# Patient Record
Sex: Male | Born: 1995 | Race: White | Hispanic: No | Smoking: Never smoker
Health system: Southern US, Community
[De-identification: ages and names within clinical notes are randomized; demographics above are authoritative.]

## PROBLEM LIST (undated history)

## (undated) DIAGNOSIS — E785 Hyperlipidemia, unspecified: Secondary | ICD-10-CM

## (undated) DIAGNOSIS — Z8709 Personal history of other diseases of the respiratory system: Secondary | ICD-10-CM

## (undated) DIAGNOSIS — K219 Gastro-esophageal reflux disease without esophagitis: Secondary | ICD-10-CM

## (undated) HISTORY — PX: CYST EXCISION: SHX5701

## (undated) HISTORY — PX: WISDOM TOOTH EXTRACTION: SHX21

---
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
## 2016-07-09 ENCOUNTER — Encounter: Payer: Self-pay | Admitting: *Deleted

## 2016-07-09 ENCOUNTER — Emergency Department: Payer: BLUE CROSS/BLUE SHIELD

## 2016-07-09 ENCOUNTER — Emergency Department
Admission: EM | Admit: 2016-07-09 | Discharge: 2016-07-09 | Disposition: A | Discharge status: Home / Self Care | Payer: BLUE CROSS/BLUE SHIELD | Attending: Emergency Medicine | Admitting: Emergency Medicine

## 2016-07-09 DIAGNOSIS — G8929 Other chronic pain: Secondary | ICD-10-CM | POA: Diagnosis not present

## 2016-07-09 DIAGNOSIS — M25531 Pain in right wrist: Secondary | ICD-10-CM | POA: Insufficient documentation

## 2016-07-09 NOTE — ED Notes (Signed)
See triage note  Having pain pain to right wrist over the past couple of days. fell on wrist during b/b game about 1 year ago  No deformity noted   Positive pulses

## 2016-07-09 NOTE — ED Provider Notes (Signed)
[facility] Emergency Department Provider Note  ____________________________________________   First MD Initiated Contact with Patient 07/09/16 [phone]     (approximate)  I have reviewed the triage vital signs and the nursing notes.   HISTORY  Chief Complaint Wrist Pain    HPI Scott Rivas is a 21 y.o. male is here complaining of right wrist pain. Patient states that he injured his wrist a year ago in a basketball game and landed on his hand and wrist. He had pain at that time but never sought medical attention for it.Patient states his continued to have wrist pain. He is taken ibuprofen infrequently for his pain. Patient is right-hand dominant.   History reviewed. No pertinent past medical history.  There are no active problems to display for this patient.   No past surgical history on file.  Prior to Admission medications   Not on File    Allergies Patient has no known allergies.  History reviewed. No pertinent family history.  Social History Social History  Substance Use Topics  . Smoking status: Not on file  . Smokeless tobacco: Not on file  . Alcohol use Not on file    Review of Systems Constitutional: No fever/chills Cardiovascular: Denies chest pain. Respiratory: Denies shortness of breath. Gastrointestinal:   No nausea, no vomiting. Musculoskeletal: Positive right wrist pain. Skin: Negative for rash. Neurological: Negative for focal weakness or numbness.  10-point ROS otherwise negative.  ____________________________________________   PHYSICAL EXAM:  VITAL SIGNS: ED Triage Vitals [07/09/16 0725]  Enc Vitals Group     BP (!) 143/86     Pulse Rate 75     Resp 18     Temp 97.5 F (36.4 C)     Temp Source Oral     SpO2 99 %     Weight 130 lb (59 kg)     Height 5\' 10"  (1.778 m)     Head Circumference      Peak Flow      Pain Score      Pain Loc      Pain Edu?      Excl. in GC?     Constitutional: Alert and  oriented. Well appearing and in no acute distress. Eyes: Conjunctivae are normal. PERRL. EOMI. Head: Atraumatic. Nose: No congestion/rhinnorhea. Neck: No stridor.   Cardiovascular: Normal rate, regular rhythm. Grossly normal heart sounds.  Good peripheral circulation. Respiratory: Normal respiratory effort.  No retractions. Lungs CTAB. Musculoskeletal: On examination of the right hand and wrist there is no gross deformity noted there is no soft tissue swelling present. Range of motion is without restrictions with minimal pain. There is some tenderness on palpation of the distal radius. Digits distally move without difficulty and patient is able make a complete fist without pain. Capillary refill is less than 3 seconds and motor sensory function intact. Neurologic:  Normal speech and language. No gross focal neurologic deficits are appreciated. No gait instability. Skin:  Skin is warm, dry and intact. No erythema, ecchymosis or abrasions are noted. Psychiatric: Mood and affect are normal. Speech and behavior are normal.  ____________________________________________   LABS (all labs ordered are listed, but only abnormal results are displayed)  Labs Reviewed - No data to display  RADIOLOGY Right wrist. Radiologist is negative for fracture dislocation. I, Tommi Rumps, personally viewed and evaluated these images (plain radiographs) as part of my medical decision making, as well as reviewing the written report by the radiologist. ____________________________________________   PROCEDURES  Procedure(s) performed: None  Procedures  Critical Care performed: No  ____________________________________________   INITIAL IMPRESSION / ASSESSMENT AND PLAN / ED COURSE  Pertinent labs & imaging results that were available during my care of the patient were reviewed by me and considered in my medical decision making (see chart for details).    Clinical Course    Patient will continue  taking ibuprofen as needed for pain. He is to call and make an appointment with Dr. Ernest PineHooten who is the orthopedist on call today. He was made aware that his x-ray did not show any bony abnormalities.  ____________________________________________   FINAL CLINICAL IMPRESSION(S) / ED DIAGNOSES  Final diagnoses:  Chronic pain of right wrist      NEW MEDICATIONS STARTED DURING THIS VISIT:  There are no discharge medications for this patient.    Note:  This document was prepared using Dragon voice recognition software and may include unintentional dictation errors.    Tommi Rumpshonda L Billey Wojciak, PA-C 07/09/16 1217    Jeanmarie PlantJames A McShane, MD 07/09/16 [phone]

## 2016-07-09 NOTE — Discharge Instructions (Signed)
Call and make an appointment with the orthopedist on call. Dr. [facility]. Continue ibuprofen as needed. X-rays today failed to show any bony abnormalities.

## 2016-07-09 NOTE — ED Triage Notes (Signed)
States right wrist pain, original injury occurred a year ago when he fell during basketball and caught himself on his right wrist

## 2016-07-19 ENCOUNTER — Ambulatory Visit: Payer: Self-pay | Admitting: Physician Assistant

## 2016-07-19 VITALS — BP 108/80 | HR 95 | Temp 98.6°F

## 2016-07-19 DIAGNOSIS — J069 Acute upper respiratory infection, unspecified: Secondary | ICD-10-CM

## 2016-07-19 LAB — POCT INFLUENZA A/B
INFLUENZA B, POC: NEGATIVE
Influenza A, POC: NEGATIVE

## 2016-07-19 MED ORDER — AZITHROMYCIN 250 MG PO TABS: ORAL_TABLET | ORAL | 0 refills | Status: DC

## 2016-07-19 NOTE — Progress Notes (Signed)
S: C/o runny nose and congestion for 3 days, no fever, chills, cp/sob, v/d; mucus was green this am but clear throughout the day, cough is sporadic, some sore throat, his partner at work is also sick  Using otc meds:   O: PE: vitals wnl, nad, perrl eomi, normocephalic, tms dull, nasal mucosa red and swollen, throat injected, neck supple no lymph, lungs c t a, cv rrr, neuro intact, flu swab neg  A:  Acute uri   P: drink fluids, continue regular meds , use otc meds of choice, return if not improving in 5 days, return earlier if worsening , zpack

## 2016-11-06 ENCOUNTER — Observation Stay
Admission: EM | Admit: 2016-11-06 | Discharge: 2016-11-07 | Disposition: A | Discharge status: Home / Self Care | Payer: BLUE CROSS/BLUE SHIELD | Attending: Cardiothoracic Surgery | Admitting: Cardiothoracic Surgery

## 2016-11-06 ENCOUNTER — Observation Stay: Payer: BLUE CROSS/BLUE SHIELD

## 2016-11-06 ENCOUNTER — Emergency Department: Payer: BLUE CROSS/BLUE SHIELD

## 2016-11-06 ENCOUNTER — Encounter: Payer: Self-pay | Admitting: Emergency Medicine

## 2016-11-06 DIAGNOSIS — J9383 Other pneumothorax: Secondary | ICD-10-CM | POA: Diagnosis not present

## 2016-11-06 DIAGNOSIS — Z09 Encounter for follow-up examination after completed treatment for conditions other than malignant neoplasm: Secondary | ICD-10-CM

## 2016-11-06 LAB — BASIC METABOLIC PANEL
Anion gap: 7 (ref 5–15)
BUN: 13 mg/dL (ref 6–20)
CALCIUM: 9.5 mg/dL (ref 8.9–10.3)
CO2: 28 mmol/L (ref 22–32)
CREATININE: 0.9 mg/dL (ref 0.61–1.24)
Chloride: 104 mmol/L (ref 101–111)
GLUCOSE: 94 mg/dL (ref 65–99)
Potassium: 4.4 mmol/L (ref 3.5–5.1)
Sodium: 139 mmol/L (ref 135–145)

## 2016-11-06 LAB — TROPONIN I

## 2016-11-06 LAB — CBC
HCT: 46.4 % (ref 40.0–52.0)
Hemoglobin: 16 g/dL (ref 13.0–18.0)
MCH: 31 pg (ref 26.0–34.0)
MCHC: 34.6 g/dL (ref 32.0–36.0)
MCV: 89.8 fL (ref 80.0–100.0)
PLATELETS: 202 10*3/uL (ref 150–440)
RBC: 5.17 MIL/uL (ref 4.40–5.90)
RDW: 12.9 % (ref 11.5–14.5)
WBC: 5 10*3/uL (ref 3.8–10.6)

## 2016-11-06 MED ORDER — HYDROCODONE-ACETAMINOPHEN 5-325 MG PO TABS: 1.0000 | ORAL_TABLET | ORAL | Status: DC | PRN

## 2016-11-06 NOTE — ED Provider Notes (Signed)
[facility] Emergency Department Provider Note  ____________________________________________   None    (approximate)  I have reviewed the triage vital signs and the nursing notes.   HISTORY  Chief Complaint Chest Pain    HPI Scott Rivas is a 21 y.o. male with no chronic medical issues who presents for evaluation of acute onset chest pain on the right side.  He reports that he noticed it after he got up this morning and was in his normal state of health last night.  Taking deep breaths makes it worse, taking shallow breaths makes it better as does resting.  He denies shortness of breath, cough, injury, fever/chills, nausea, vomiting, abdominal pain.  He has never had these sort of symptoms in the past.  He describes the symptoms as moderate at this time.   History reviewed. No pertinent past medical history.  Patient Active Problem List   Diagnosis Date Noted  . Spontaneous pneumothorax 11/06/2016    History reviewed. No pertinent surgical history.  Prior to Admission medications   Medication Sig Start Date End Date Taking? Authorizing Provider  azithromycin (ZITHROMAX) 250 MG tablet 2 pills today then 1 pill a day for 4 days Patient not taking: Reported on 11/06/2016 07/19/16   Faythe Ghee, PA-C    Allergies Patient has no known allergies.  No family history on file.  Social History Social History  Substance Use Topics  . Smoking status: Never Smoker  . Smokeless tobacco: Never Used  . Alcohol use No    Review of Systems Constitutional: No fever/chills Eyes: No visual changes. ENT: No sore throat. Cardiovascular: Right upper chest pain worse with deep inspiration Respiratory: Denies shortness of breath. Gastrointestinal: No abdominal pain.  No nausea, no vomiting.  No diarrhea.  No constipation. Genitourinary: Negative for dysuria. Musculoskeletal: Negative for back pain. Integumentary: Negative for rash. Neurological:  Negative for headaches, focal weakness or numbness.   ____________________________________________   PHYSICAL EXAM:  VITAL SIGNS: ED Triage Vitals  Enc Vitals Group     BP 11/06/16 1559 (!) 141/84     Pulse Rate 11/06/16 1559 73     Resp 11/06/16 1559 16     Temp 11/06/16 1559 99.1 F (37.3 C)     Temp Source 11/06/16 1559 Oral     SpO2 11/06/16 1559 100 %     Weight 11/06/16 1557 130 lb (59 kg)     Height 11/06/16 1557 5\' 10"  (1.778 m)     Head Circumference --      Peak Flow --      Pain Score 11/06/16 1557 3     Pain Loc --      Pain Edu? --      Excl. in GC? --     Constitutional: Alert and oriented. Well appearing and in no acute distress. Eyes: Conjunctivae are normal. PERRL. EOMI. Head: Atraumatic. Nose: No congestion/rhinnorhea. Mouth/Throat: Mucous membranes are moist. Neck: No stridor.  No meningeal signs.   Cardiovascular: Normal rate, regular rhythm. Good peripheral circulation. Grossly normal heart sounds. Respiratory: Normal respiratory effort.  No retractions. Slightly diminished breath sounds in the right upper lobe Gastrointestinal: Soft and nontender. No distention.  Musculoskeletal: No lower extremity tenderness nor edema. No gross deformities of extremities. Neurologic:  Normal speech and language. No gross focal neurologic deficits are appreciated.  Skin:  Skin is warm, dry and intact. No rash noted. Psychiatric: Mood and affect are normal. Speech and behavior are normal.  ____________________________________________  LABS (all labs ordered are listed, but only abnormal results are displayed)  Labs Reviewed  BASIC METABOLIC PANEL  CBC  TROPONIN I   ____________________________________________  EKG  ED ECG REPORT I, Tacy Chavis, the attending physician, personally viewed and interpreted this ECG.  Date: 11/06/2016 EKG Time: 3:58 PM Rate: 76 Rhythm: normal sinus rhythm QRS Axis: normal Intervals: normal ST/T Wave abnormalities:  normal Conduction Disturbances: none Narrative Interpretation: unremarkable  ____________________________________________  RADIOLOGY Aura CampsI, Emmaline Wahba, personally viewed and evaluated these images (plain radiographs) as part of my medical decision making, as well as reviewing the written report by the radiologist.  Dg Chest 2 View  Result Date: 11/06/2016 CLINICAL DATA:  Right chest pain EXAM: CHEST  2 VIEW COMPARISON:  None. FINDINGS: Small right pneumothorax. Left lung is clear. The heart is normal in size. Visualized osseous structures are within normal limits. IMPRESSION: Small right pneumothorax. Critical Value/emergent results were called by telephone at the time of interpretation on 11/06/2016 at 4:41 pm to Dr. York CeriseForbach, who verbally acknowledged these results. Electronically Signed   By: Charline BillsSriyesh  Krishnan M.D.   On: 11/06/2016 16:43    ____________________________________________   PROCEDURES  Critical Care performed: No   Procedure(s) performed:   Procedures   ____________________________________________   INITIAL IMPRESSION / ASSESSMENT AND PLAN / ED COURSE  Pertinent labs & imaging results that were available during my care of the patient were reviewed by me and considered in my medical decision making (see chart for details).     Clinical Course as of Nov 06 1840  Tue Nov 06, 2016  1644 Received call from radiology identifying the small right-sided pneumothorax.  Paging Dr. Thelma Bargeaks to ask him to review the radiographs and offer recommendations.  Starting patient on 4L O2 by [place].  [CF]  1655 Dr. Thelma Bargeaks is in the ED now, personally evaluating the patient.  [CF]  1714 Dr. Thelma Bargeaks discussed the case with me in person and will admit.  No additional intervention at this time.  The patient is on 4L O2 by [place] and is in no acute distress.  [CF]    Clinical Course User Index [CF] Loleta RoseForbach, Joy Reiger, MD    ____________________________________________  FINAL CLINICAL IMPRESSION(S) /  ED DIAGNOSES  Final diagnoses:  Spontaneous pneumothorax     MEDICATIONS GIVEN DURING THIS VISIT:  Medications  HYDROcodone-acetaminophen (NORCO/VICODIN) 5-325 MG per tablet 1-2 tablet (not administered)     NEW OUTPATIENT MEDICATIONS STARTED DURING THIS VISIT:  New Prescriptions   No medications on file    Modified Medications   No medications on file    Discontinued Medications   No medications on file     Note:  This document was prepared using Dragon voice recognition software and may include unintentional dictation errors.    Loleta RoseForbach, Fernando Stoiber, MD 11/06/16 [phone]

## 2016-11-06 NOTE — ED Triage Notes (Signed)
C/O chest pain with deep breathing, onset today at around 1230.  Denies history of cough.

## 2016-11-06 NOTE — ED Notes (Signed)
Dr. Thelma Bargeaks in room to discuss admission. Pt placed on 4L [place] at this time. No respiratory distress noted, c/o chest pain on the right side.

## 2016-11-06 NOTE — H&P (Signed)
Patient ID: Scott Rivas, male   DOB: [birth year], 21 y.o.   MRN: [id number]  Chief Complaint  Patient presents with  . Chest Pain    HPI Scott Rivas is a 21 y.o. male.  He was awoken this afternoon at approximately noon time. Shortly after arising he noticed some right-sided chest pain which persisted. He did have some shortness of breath with deep inspiration but otherwise had no other complaints. He did not have any recent cough or trauma. He's never had any prior episodes similar to this. The pain persisted and he became concerned about the possible etiologies of his new onset of chest pain and he presented to the emergency room where chest x-ray was done revealing a small right-sided pneumothorax without evidence of tension. I was asked to evaluate the patient for consideration of options for management.  He states that his pain is somewhat improved. He still denies any significant shortness of breath. He has been in the emergency department for the last few hours without any change in his overall clinical condition.   History reviewed. No pertinent past medical history.  History reviewed. No pertinent surgical history.  No family history on file.  Social History Social History  Substance Use Topics  . Smoking status: Never Smoker  . Smokeless tobacco: Never Used  . Alcohol use No    No Known Allergies  No current facility-administered medications for this encounter.    Current Outpatient Prescriptions  Medication Sig Dispense Refill  . azithromycin (ZITHROMAX) 250 MG tablet 2 pills today then 1 pill a day for 4 days 6 tablet 0      Review of Systems A complete review of systems was asked and was negative except for the following positive findings - right-sided chest pain improving.  Blood pressure 129/86, pulse 73, temperature 99.1 F (37.3 C), temperature source Oral, resp. rate 18, height 5\' 10"  (1.778 m), weight 130 lb (59 kg), SpO2 100 %.  Physical  Exam CONSTITUTIONAL:  Pleasant, well-developed, well-nourished, and in no acute distress. EYES: Pupils equal and reactive to light, Sclera non-icteric EARS, NOSE, MOUTH AND THROAT:  The oropharynx was clear.  Dentition is good repair.  Oral mucosa pink and moist. LYMPH NODES:  Lymph nodes in the neck and axillae were normal RESPIRATORY:  Lungs were clear on the left and slightly diminished at the right apex..  Normal respiratory effort without pathologic use of accessory muscles of respiration CARDIOVASCULAR: Heart was regular without murmurs.  There were no carotid bruits. GI: The abdomen was soft, nontender, and nondistended. There were no palpable masses. There was no hepatosplenomegaly. There were normal bowel sounds in all quadrants. GU:   MUSCULOSKELETAL:  Normal muscle strength and tone.  No clubbing or cyanosis.   SKIN:  There were no pathologic skin lesions.  There were no nodules on palpation. NEUROLOGIC:  Sensation is normal.  Cranial nerves are grossly intact. PSYCH:  Oriented to person, place and time.  Mood and affect are normal.  Data Reviewed Chest x-ray  I have personally reviewed the patient's imaging and medical records.    Assessment    I have independently reviewed the patient's chest x-ray. There is a small pneumothorax present. There is no signs of tension.    Plan    I have reviewed the potential etiologies for his spontaneous pneumothorax. The patient denies any history of smoking or marijuana use. He denies any history of HIV. There is no symptoms to suggest any underlying lung condition. I  therefore explained to him that I thought the most likely etiology was apical blebs. I reviewed with him the options. He would like to attempt the most least invasive management option possible. I explained to him that it would be my recommendation that he would be admitted to the hospital and placed on nasal cannula oxygen with repeat chest x-ray in several hours followed by  another chest x-ray tomorrow. If his pneumothorax does not progress he may be managed as an outpatient. I also described to him in detail the indications and risks of chest tube placement. He understands that this may hasten the resolution of his pneumothorax. I also reviewed with him the options of thoracoscopy but at the present time since this is his first occurrence I did not recommend that. He understood and we will admit the patient to the hospital for non-operative management of his pneumothorax       Hulda Marinimothy Kemuel Buchmann, MD 11/06/2016, 6:02 PM

## 2016-11-07 ENCOUNTER — Observation Stay: Payer: BLUE CROSS/BLUE SHIELD

## 2016-11-07 ENCOUNTER — Other Ambulatory Visit: Payer: Self-pay

## 2016-11-07 DIAGNOSIS — J9383 Other pneumothorax: Secondary | ICD-10-CM

## 2016-11-07 NOTE — Progress Notes (Signed)
Discharge instructions explained to pt/ verbalized an understanding/ iv and tele removed/ pt ambulated around nursing station/ tolerated well/ will transport off unit via wheelchair when ride arrives.

## 2016-11-07 NOTE — Progress Notes (Signed)
  Patient ID: Scott Rivas, male   DOB: October 19, 1995, 21 y.o.   MRN: [id number]  HISTORY: He did well overnight without any significant complaints. He states that his pain is only a 1 out of 10 and that is only if he has a deep inspiration. He does not complain of any shortness of breath. His oxygen saturations have been 100% on nasal cannula oxygen. I removed his oxygen today and after several minutes his oxygen saturations remained between 98 and 100.   Vitals:   11/06/16 2116 11/07/16 0402  BP: (!) 150/91 130/74  Pulse: 60 62  Resp: 16 16  Temp: 98.1 F (36.7 C)      EXAM:    Resp: Lungs are clear bilaterally though they are slightly diminished on the right..  No respiratory distress, normal effort. Heart:  Regular without murmurs Neurological: Alert and oriented to person, place, and time. Coordination normal.  Skin: Skin is warm and dry. No rash noted. No diaphoretic. No erythema. No pallor.  Psychiatric: Normal mood and affect. Normal behavior. Judgment and thought content normal.    ASSESSMENT: I have independently reviewed his chest x-ray from this morning. I reviewed the ones from last night and yesterday afternoon as well. There is a spontaneous right-sided pneumothorax that appears grossly unchanged from yesterday afternoon till today.   PLAN:   I reviewed with him the options. This would include continued nonsurgical management or insertion of a chest tube. I explained the indications and risks as well as the advantages and disadvantages of these options. He would like to continue the nonoperative management he feels that he is ready for discharge today. I will follow-up with him tomorrow in the office with another chest x-ray. I explained to him that he has any significant problems that he should return. He does have a roommate that he lives with and he lives less than 10 minutes away from the institution.    [name], MD

## 2016-11-08 ENCOUNTER — Encounter: Payer: Self-pay | Admitting: Cardiothoracic Surgery

## 2016-11-08 ENCOUNTER — Ambulatory Visit (INDEPENDENT_AMBULATORY_CARE_PROVIDER_SITE_OTHER): Payer: BLUE CROSS/BLUE SHIELD | Admitting: Cardiothoracic Surgery

## 2016-11-08 ENCOUNTER — Ambulatory Visit
Admission: RE | Admit: 2016-11-08 | Discharge: 2016-11-08 | Disposition: A | Discharge status: Home / Self Care | Payer: BLUE CROSS/BLUE SHIELD | Source: Ambulatory Visit | Attending: Cardiothoracic Surgery | Admitting: Cardiothoracic Surgery

## 2016-11-08 VITALS — BP 111/74 | HR 64 | Temp 97.8°F | Ht 71.0 in | Wt 134.0 lb

## 2016-11-08 DIAGNOSIS — J9383 Other pneumothorax: Secondary | ICD-10-CM | POA: Insufficient documentation

## 2016-11-08 NOTE — Patient Instructions (Signed)
We will see you back on 11/12/2016. Please [facility] and have your Chest X-Ray done prior to your appointment.

## 2016-11-08 NOTE — Progress Notes (Signed)
  Patient ID: Scott Rivas, male   DOB: 12-31-1995, 21 y.o.   MRN: [id number]  HISTORY: He returns today in follow-up. He denies any chest pain. He denies any fevers or chills, cough, shortness of breath.   Vitals:   11/08/16 0856  BP: 111/74  Pulse: 64  Temp: 97.8 F (36.6 C)     EXAM:    Resp: Lungs are clear bilaterally.  No respiratory distress, normal effort. Heart:  Regular without murmurs Abd:  Abdomen is soft, non distended and non tender. No masses are palpable.  There is no rebound and no guarding.  Neurological: Alert and oriented to person, place, and time. Coordination normal.  Skin: Skin is warm and dry. No rash noted. No diaphoretic. No erythema. No pallor.  Psychiatric: Normal mood and affect. Normal behavior. Judgment and thought content normal.    ASSESSMENT: He did have a chest x-ray made today which have independently reviewed. I see no significant change in the right sided hydropneumothorax. The official radiology interpretation is that it may be slightly smaller. In any event he will come back to see us next week at which time we'll get another chest x-ray.   PLAN:   Follow-up next Monday for his right sided spontaneous pneumothorax. He should not return to work until he is seen next week.    Hulda Marinimothy Nai Borromeo, MD

## 2016-11-09 NOTE — Discharge Summary (Signed)
Physician Discharge Summary  Patient ID: Scott Rivas MRN: [id number] DOB/AGE: Dec 27, 1995 20 y.o.  Admit date: 11/06/2016 Discharge date: 11/09/2016   Discharge Diagnoses:  Active Problems:   Spontaneous pneumothorax   Procedures:None  Hospital Course: Patient was admitted to the hospital for observation. He underwent chest x-ray examination on 2 additional recurrences. On the morning of discharge she felt fine without any symptoms of shortness of breath or chest pain. His chest x-ray showed a small pneumothorax which was unchanged. He lives within 10 minutes of the hospital and he felt that he was stable enough to go home and come back if he developed any significant shortness of breath, chest pain, cough or fever. I will see him back in the office tomorrow.  Disposition: 01-Home or Self Care  Discharge Instructions    Call MD for:    Complete by:  As directed    Shortness of breath, chest pain or rapid heart rate   Diet - low sodium heart healthy    Complete by:  As directed    Increase activity slowly    Complete by:  As directed      Allergies as of 11/07/2016   No Known Allergies     Medication List    STOP taking these medications   azithromycin 250 MG tablet Commonly known as:  [id number]      Follow-up Information    Hulda Marinaks, Winnell Bento, MD. Go on 11/08/2016.   Specialty:  Cardiothoracic Surgery Why:  Appointment Time: 9:00am, PLEASE ARRIVE AT 8:00am TO HAVE A CHEST XRAY DONE BEFORE APPOINTMENT. [facility]  Contact information: [address] [place] [id number] [phone]           Hulda Marinimothy Weslie Rasmus, MD

## 2016-11-12 ENCOUNTER — Ambulatory Visit (INDEPENDENT_AMBULATORY_CARE_PROVIDER_SITE_OTHER): Payer: BLUE CROSS/BLUE SHIELD | Admitting: Cardiothoracic Surgery

## 2016-11-12 ENCOUNTER — Ambulatory Visit
Admission: RE | Admit: 2016-11-12 | Discharge: 2016-11-12 | Disposition: A | Discharge status: Home / Self Care | Payer: BLUE CROSS/BLUE SHIELD | Source: Ambulatory Visit | Attending: Cardiothoracic Surgery | Admitting: Cardiothoracic Surgery

## 2016-11-12 ENCOUNTER — Encounter: Payer: Self-pay | Admitting: Cardiothoracic Surgery

## 2016-11-12 VITALS — BP 122/81 | HR 66 | Temp 97.7°F | Wt 135.0 lb

## 2016-11-12 DIAGNOSIS — J9383 Other pneumothorax: Secondary | ICD-10-CM | POA: Insufficient documentation

## 2016-11-12 NOTE — Progress Notes (Signed)
  Patient ID: Scott Rivas, male   DOB: 1995/07/10, 21 y.o.   MRN: [id number]  HISTORY: He returns today in follow-up. He denied any fevers or chills. He denied any chest pain or cough. He has been using his incentive spirometer.   Vitals:   11/12/16 0841  BP: 122/81  Pulse: 66  Temp: 97.7 F (36.5 C)     EXAM:    Resp: Lungs are clear on the left and perhaps slightly diminished at the right apex..  No respiratory distress, normal effort. Heart:  Regular without murmurs Abd:  Abdomen is soft, non distended and non tender. No masses are palpable.  There is no rebound and no guarding.  Neurological: Alert and oriented to person, place, and time. Coordination normal.  Skin: Skin is warm and dry. No rash noted. No diaphoretic. No erythema. No pallor.  Psychiatric: Normal mood and affect. Normal behavior. Judgment and thought content normal.    ASSESSMENT: I have independently reviewed the patient's chest x-ray from today and compared it to his prior films. I do believe that the hot hydropneumothorax is improved. He would like to go back to work. I explained to him that he could do so. He understands that he has any problems with shortness of breath or chest pain that he is to return. He understands that there is a 10% risk that this may happen both on the right or the left sides.   PLAN:   We did allow him go back to work. He should avoid any major lifting. He will return to see us in 2 weeks.    Hulda Marinimothy Parris Signer, MD

## 2016-11-12 NOTE — Patient Instructions (Signed)
At this time, you are able to return to work full-time with no restrictions. I will need to see you back in two weeks to make sure that you have improved.

## 2016-11-23 ENCOUNTER — Encounter: Payer: Self-pay | Admitting: Cardiothoracic Surgery

## 2016-11-23 ENCOUNTER — Ambulatory Visit
Admission: RE | Admit: 2016-11-23 | Discharge: 2016-11-23 | Disposition: A | Discharge status: Home / Self Care | Payer: BLUE CROSS/BLUE SHIELD | Source: Ambulatory Visit | Attending: Cardiothoracic Surgery | Admitting: Cardiothoracic Surgery

## 2016-11-23 ENCOUNTER — Ambulatory Visit (INDEPENDENT_AMBULATORY_CARE_PROVIDER_SITE_OTHER): Payer: BLUE CROSS/BLUE SHIELD | Admitting: Cardiothoracic Surgery

## 2016-11-23 VITALS — BP 113/81 | HR 73 | Temp 97.7°F | Ht 71.0 in | Wt 133.4 lb

## 2016-11-23 DIAGNOSIS — J9383 Other pneumothorax: Secondary | ICD-10-CM

## 2016-11-23 NOTE — Patient Instructions (Signed)
Please give us a call in case if you have any questions or concerns.

## 2016-11-23 NOTE — Progress Notes (Signed)
  Patient ID: Scott Rivas, male   DOB: Feb 07, 1996, 21 y.o.   MRN: [id number]  HISTORY: He has done very well. He is back at work although he is not working out at [employer]. He denies any shortness of breath or chest pain.   Vitals:   11/23/16 0805  BP: 113/81  Pulse: 73  Temp: 97.7 F (36.5 C)     EXAM:    Resp: Lungs are clear bilaterally.  No respiratory distress, normal effort. Heart:  Regular without murmurs Abd:  Abdomen is soft, non distended and non tender. No masses are palpable.  There is no rebound and no guarding.  Neurological: Alert and oriented to person, place, and time. Coordination normal.  Skin: Skin is warm and dry. No rash noted. No diaphoretic. No erythema. No pallor.  Psychiatric: Normal mood and affect. Normal behavior. Judgment and thought content normal.   I have independently reviewed his chest x-ray from today. There is no pneumothorax   ASSESSMENT: Right sided spontaneous pneumothorax which is now resolved   PLAN:   He may go back to work full-time. He can continue working out in [employer]. He understands that there is a 10% chance of recurrence. He knows to pain medical care soon as possible.    Hulda Marinimothy Karelyn Brisby, MD

## 2017-08-14 ENCOUNTER — Ambulatory Visit: Payer: Self-pay

## 2018-07-20 ENCOUNTER — Other Ambulatory Visit
Admission: RE | Admit: 2018-07-20 | Discharge: 2018-07-20 | Disposition: A | Discharge status: Home / Self Care | Payer: BLUE CROSS/BLUE SHIELD | Attending: Family Medicine | Admitting: Family Medicine

## 2018-07-20 ENCOUNTER — Emergency Department
Admission: EM | Admit: 2018-07-20 | Discharge: 2018-07-20 | Disposition: A | Discharge status: Home / Self Care | Payer: BLUE CROSS/BLUE SHIELD
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
## 2019-03-03 ENCOUNTER — Encounter: Payer: Self-pay | Admitting: Registered Nurse

## 2019-03-03 ENCOUNTER — Encounter: Payer: Self-pay | Admitting: Adult Health

## 2019-03-03 ENCOUNTER — Ambulatory Visit: Payer: Self-pay | Admitting: Registered Nurse

## 2019-03-03 ENCOUNTER — Other Ambulatory Visit: Payer: Self-pay

## 2019-03-03 DIAGNOSIS — B001 Herpesviral vesicular dermatitis: Secondary | ICD-10-CM

## 2019-03-03 MED ORDER — VALACYCLOVIR HCL 1 G PO TABS: ORAL_TABLET | ORAL | 0 refills | Status: DC

## 2019-03-03 NOTE — Progress Notes (Signed)
Subjective:    Patient ID: Scott Rivas, male    DOB: 06-Dec-1995, 23 y.o.   MRN: [id number]  23y/o caucasian male established to clinic new to me agreed to telephone visit could not connect to video visit.  PMHx cold sores last valtrex rx from urgent care ran out of tabs today had 1000mg  take BID.  Took one tab yesterday and another this am.  Also applies abbreva to his cold sores.  Has noticed they are increasing in frequency about every other month.  Denied fever/chills/dyspnea/dysphagia/headache/trouble speaking or eating.  Patient also using another medication topical that starts with the letter C but he cannot remember the entire name.  Wondering if he should continue or stop use.  See picture in Epic patient submitted on lip lesion today.  Work has him rotating days to nights every 2 weeks.  [employer].     Review of Systems  Constitutional: Negative for activity change, appetite change, chills, diaphoresis, fatigue and fever.  HENT: Positive for mouth sores. Negative for dental problem, drooling, sore throat, trouble swallowing and voice change.   Eyes: Negative for photophobia, pain, discharge, redness, itching and visual disturbance.  Respiratory: Negative for cough, choking, shortness of breath, wheezing and stridor.   Cardiovascular: Negative for chest pain.  Gastrointestinal: Negative for diarrhea, nausea and vomiting.  Endocrine: Negative for cold intolerance and heat intolerance.  Genitourinary: Negative for difficulty urinating.  Musculoskeletal: Negative for myalgias, neck pain and neck stiffness.  Allergic/Immunologic: Negative for food allergies and immunocompromised state.  Neurological: Negative for dizziness, tremors, seizures, syncope, facial asymmetry, speech difficulty, weakness, light-headedness, numbness and headaches.  Hematological: Negative for adenopathy. Does not bruise/bleed easily.  Psychiatric/Behavioral: Negative for agitation,  confusion and sleep disturbance.       Objective:   Physical Exam Nursing note reviewed.  Constitutional:      General: He is awake. He is not in acute distress.    Appearance: Normal appearance. He is well-developed and well-groomed. He is not ill-appearing, toxic-appearing or diaphoretic.  HENT:     Head: Normocephalic. No right periorbital erythema or left periorbital erythema.     Jaw: There is normal jaw occlusion.     Right Ear: Hearing normal.     Left Ear: Hearing normal.     Nose: Nose normal. No rhinorrhea.     Mouth/Throat:     Lips: Pink. Lesions present.     Mouth: No lacerations or angioedema.   Eyes:     Pupils: Pupils are equal, round, and reactive to light.  Pulmonary:     Effort: Pulmonary effort is normal. No respiratory distress.     Breath sounds: Normal breath sounds and air entry. No stridor or transmitted upper airway sounds. No wheezing, rhonchi or rales.     Comments: No cough heard during telephone conversation; spoke full sentences without difficulty Skin:    General: Skin is warm and dry.     Coloration: Skin is not ashen, cyanotic, jaundiced, mottled, pale or sallow.     Findings: Rash present. No abscess, bruising, burn, ecchymosis, erythema, laceration, lesion or petechiae. Rash is crusting, macular and vesicular. Rash is not nodular, papular, purpuric, pustular, scaling or urticarial.  Neurological:     Mental Status: He is alert and oriented to person, place, and time. Mental status is at baseline.     GCS: GCS verbal subscore is 5.  Psychiatric:        Attention and Perception: Attention and perception normal.  Mood and Affect: Mood and affect normal.        Speech: Speech normal.        Behavior: Behavior normal. Behavior is cooperative.        Thought Content: Thought content normal.        Cognition and Memory: Cognition and memory normal.        Judgment: Judgment normal.      Telephone call duration 11 minutes      Assessment & Plan:  A-herpes labialis recurrent  P-Valtrex and abbreva have worked well for patient in the past.  Noted increased frequency outbreaks now every other month.  Discussed with patient if occurring monthly consider starting daily suppression dose valtrex 500mg  po. Refill valtrex 1000mg  take 2 tabs at onset symptoms and repeat 1 dose in 12 hours #32 RF0 electronically to his pharmacy of choice.  Discussed first symptom the electrical tingling sensation of lip not the rash.  Sooner treatment started the better.  He thought he was supposed to wait until rash outbreak.   Discussed if getting run down (not enough sleep), lips chapped/sunburned, unhealthy diet low on zinc, B vitamins and C vitamins this could contribute also to increased outbreaks.  Avoid intimate oral contact while outbreak occurring.  Do not share eating/drinking utensils.  Wash dishes/utensils in hot water or dish washer.  Avoid picking at lesions.  May apply abbreva per manufacturer instructions.  Wash hands after application.  Try to get 7-8 hours of sleep each night.  Exitcare handout on cold sores.  Follow up if outbreaks occurring monthly or signs and symptoms bacterial infection e.g. fever, purulent discharge, facial swelling, chills, dyspnea and/or dysphagia.  Patient verbalized understanding information/instructions, agreed with plan of care and had no further questions at this time.

## 2019-03-03 NOTE — Patient Instructions (Signed)
Cold Sore  A cold sore, also called a fever blister, is a small, fluid-filled sore that forms inside the mouth or on the lips, gums, nose, chin, or cheeks. Cold sores can spread to other parts of the body, such as the eyes or fingers. In some people who have other medical conditions, cold sores can spread to multiple other body sites, including the genitals. Cold sores can spread from person to person (are contagious) until the sores crust over completely. Most cold sores go away within 2 weeks. What are the causes? Cold sores are caused by an infection from a common type of herpes simplex virus (HSV-1). HSV-1 is closely related to the HSV-2virus, which is the virus that causes genital herpes, but these viruses are not the same. Once a person is infected with HSV-1, the virus remains permanently in the body. HSV-1 is spread from person to person through close contact, such as through kissing, touching the affected area, or sharing personal items such as lip balm, razors, a drinking glass, or eating utensils. What increases the risk? You are more likely to develop this condition if you:  Are tired, stressed, or sick.  Are menstruating.  Are pregnant.  Take certain medicines.  Are exposed to cold weather or too much sun. What are the signs or symptoms? Symptoms of a cold sore outbreak go through different stages. These are the stages of a cold sore:  Tingling, itching, or burning is felt 1-2 days before the outbreak.  Fluid-filled blisters appear on the lips, inside the mouth, on the nose, or on the cheeks.  The blisters start to ooze clear fluid.  The blisters dry up, and a yellow crust appears in their place.  The crust falls off. In some cases, other symptoms can develop during a cold sore outbreak. These can include:  Fever.  Sore throat.  Headache.  Muscle aches.  Swollen neck glands. How is this diagnosed? This condition is diagnosed based on your medical history and a  physical exam. Your health care provider may do a blood test or may swab some fluid from your sore and then examine the swab in the lab. How is this treated? There is no cure for cold sores or HSV-1. There is also no vaccine for HSV-1. Most cold sores go away on their own without treatment within 2 weeks. Medicines cannot make the infection go away, but your health care provider may prescribe medicines to:  Help relieve some of the pain associated with the sores.  Work to stop the virus from multiplying.  Shorten healing time. Medicines may be in the form of creams, gels, pills, or a shot. Follow these instructions at home: Medicines  Take or apply over-the-counter and prescription medicines only as told by your health care provider.  Use a cotton-tip swab to apply creams or gels to your sores.  Ask your health care provider if you can take lysine supplements. Research has found that lysine may help heal the cold sore faster and prevent outbreaks. Sore care   Do not touch the sores or pick the scabs.  Wash your hands often. Do not touch your eyes without washing your hands first.  Keep the sores clean and dry.  If directed, apply ice to the sores: ? Put ice in a plastic bag. ? Place a towel between your skin and the bag. ? Leave the ice on for 20 minutes, 2-3 times a day. Eating and drinking  Eat a soft, bland diet. Avoid eating   hot, cold, or salty foods.  Use a straw if it hurts to drink out of a glass.  Eat foods that are rich in lysine, such as meat, fish, and dairy products.  Avoid sugary foods, chocolates, nuts, and grains. These foods are rich in a nutrient called arginine, which can cause the virus to multiply. Lifestyle  Do not kiss, have oral sex, or share personal items until your sores heal.  Stress, poor sleep, and being out in the sun can trigger outbreaks. Make sure you: ? Do activities that help you relax, such as deep breathing exercises or meditation. ?  Get enough sleep. ? Apply sunscreen on your lips before you go out in the sun. Contact a health care provider if:  You have symptoms for more than 2 weeks.  You have pus coming from the sores.  You have redness that is spreading.  You have pain or irritation in your eye.  You get sores on your genitals.  Your sores do not heal within 2 weeks.  You have frequent cold sore outbreaks. Get help right away if you have:  A fever and your symptoms suddenly get worse.  A headache and confusion.  Fatigue or loss of appetite.  A stiff neck or sensitivity to light. Summary  A cold sore, also called a fever blister, is a small, fluid-filled sore that forms inside the mouth or on the lips, gums, nose, chin, or cheeks.  Most cold sores go away on their own without treatment within 2 weeks. Your health care provider may prescribe medicines to help relieve some of the pain, work to stop the virus from multiplying, and shorten healing time.  Wash your hands often. Do not touch your eyes without washing your hands first.  Do not kiss, have oral sex, or share personal items until your sores heal.  Contact a health care provider if your sores do not heal within 2 weeks. This information is not intended to replace advice given to you by your health care provider. Make sure you discuss any questions you have with your health care provider. Document Released: 06/08/2000 Document Revised: 10/01/2018 Document Reviewed: 11/11/2017 Elsevier Patient Education  2020 Elsevier Inc.  

## 2019-03-04 NOTE — Addendum Note (Signed)
Addended by: Judie Petit on: 03/04/2019 01:15 PM   Modules accepted: Level of Service

## 2019-03-04 NOTE — Telephone Encounter (Signed)
Patient completed telephone appt 03/03/2019 see Epic note

## 2019-05-24 IMAGING — CR DG CHEST 2V
1 series · 2 of 2 positions shown · non-contrast
Comparison: November 12, 2016 and November 08, 2016

CLINICAL DATA: Recent spontaneous right-sided pneumothorax

EXAM:
CHEST  2 VIEW

[Series 1: w chest pa · 0.14mm/px · 2 of 2 slices shown]
[im 1/2]
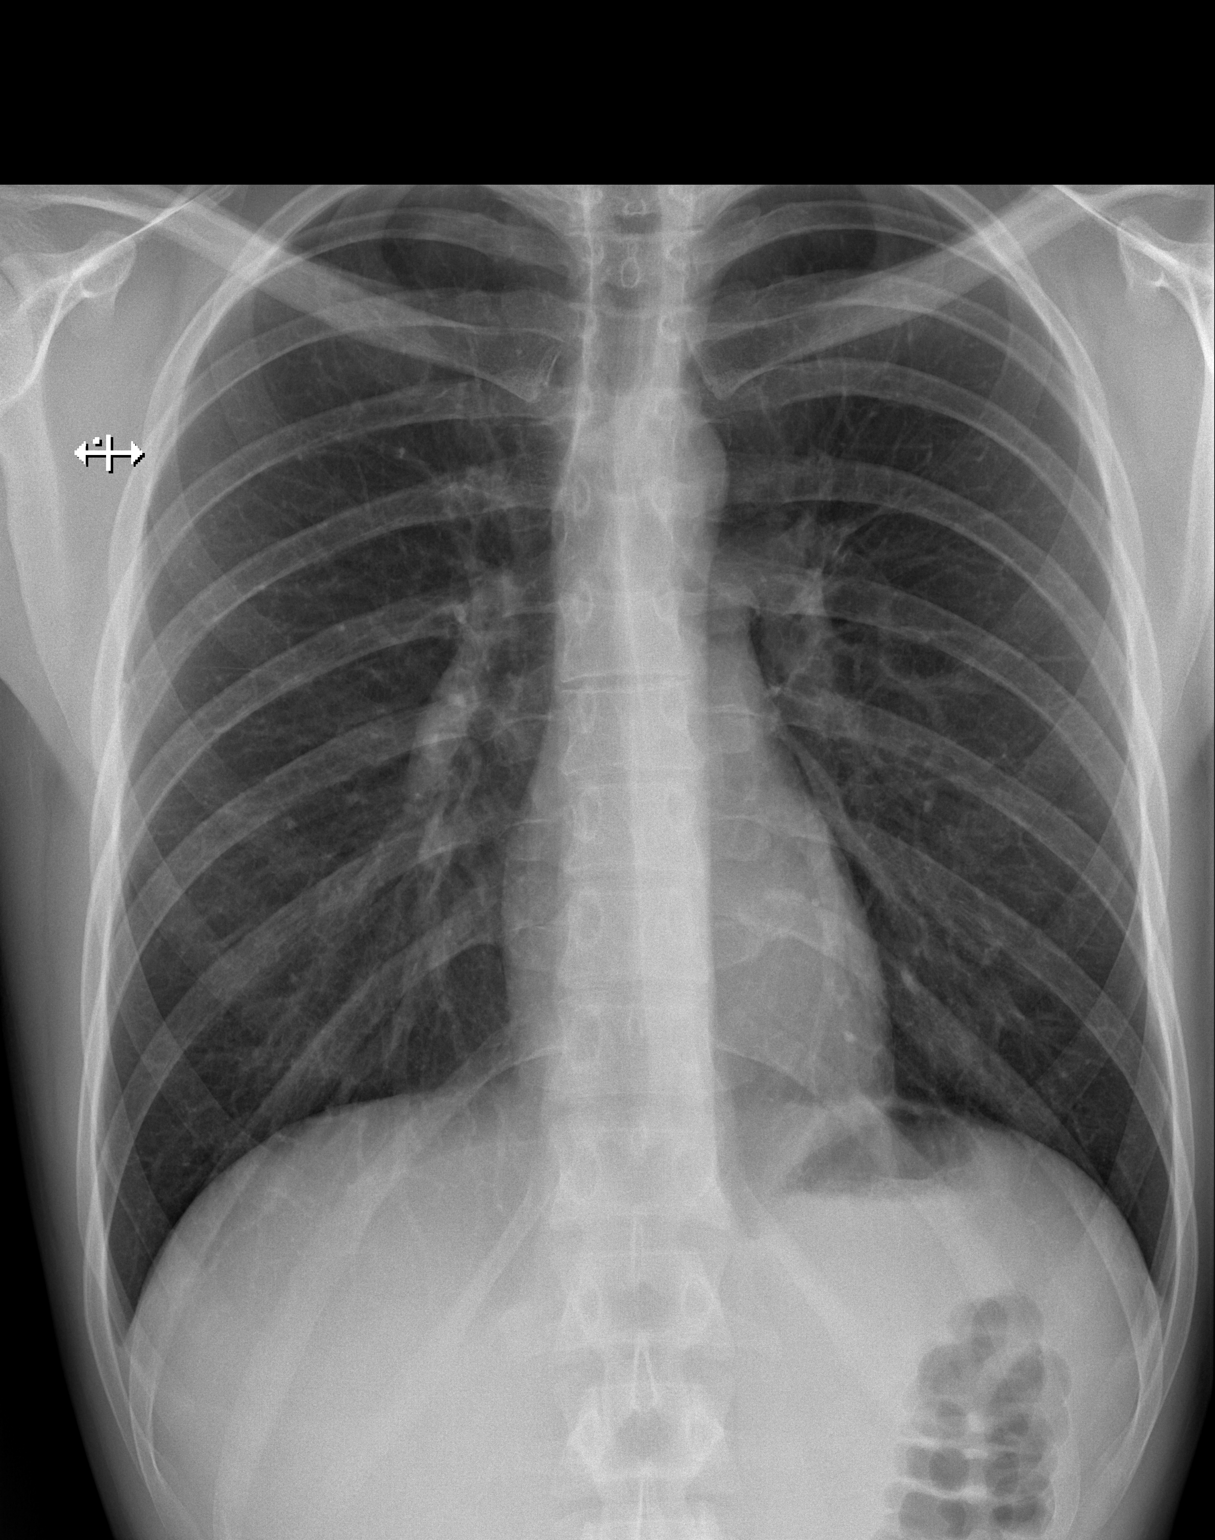
[im 2/2]
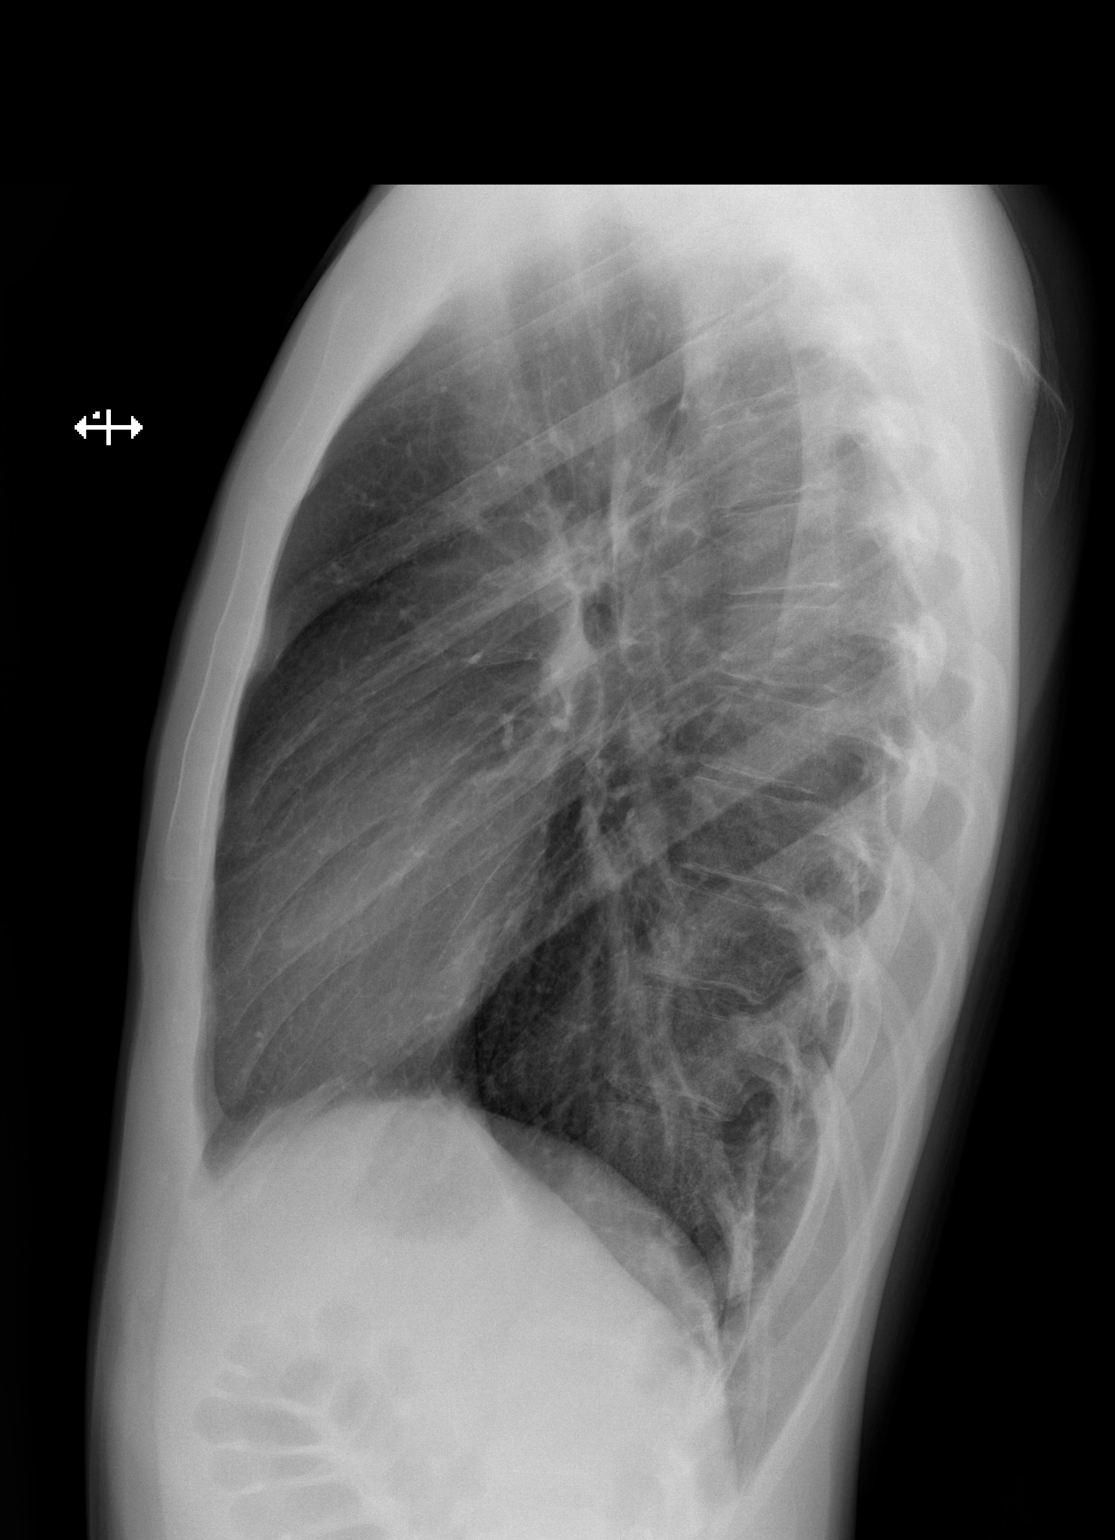

[2 of 2 positions shown; findings below may reference images not displayed]

FINDINGS: Previously noted pneumothorax on the right has resolved. Currently
there is no evident pneumothorax on either side. Lungs are clear.
Heart size and pulmonary vascularity are normal. No adenopathy. No
bone lesions.
IMPRESSION: No appreciable pneumothorax currently. Lungs clear. Stable cardiac
silhouette.

## 2020-07-23 ENCOUNTER — Emergency Department
Admission: EM | Admit: 2020-07-23 | Discharge: 2020-07-23 | Disposition: A | Discharge status: Home / Self Care | Payer: Worker's Compensation | Attending: Emergency Medicine | Admitting: Emergency Medicine

## 2020-07-23 ENCOUNTER — Encounter: Payer: Self-pay | Admitting: Emergency Medicine

## 2020-07-23 DIAGNOSIS — Y92410 Unspecified street and highway as the place of occurrence of the external cause: Secondary | ICD-10-CM | POA: Diagnosis not present

## 2020-07-23 DIAGNOSIS — Z0283 Encounter for blood-alcohol and blood-drug test: Secondary | ICD-10-CM

## 2020-07-23 DIAGNOSIS — R825 Elevated urine levels of drugs, medicaments and biological substances: Secondary | ICD-10-CM | POA: Insufficient documentation
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
## 2020-07-23 NOTE — ED Notes (Signed)
No peripheral IV placed this visit.   Discharge instructions reviewed with patient. Questions fielded by this RN. Patient verbalizes understanding of instructions. Patient discharged home in stable condition per forbach. No acute distress noted at time of discharge.   Pt ambu to lobby, pt reports anxious to return to work
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
## 2020-08-15 ENCOUNTER — Other Ambulatory Visit: Payer: Self-pay

## 2020-08-15 ENCOUNTER — Encounter: Payer: Self-pay | Admitting: Emergency Medicine

## 2020-08-15 DIAGNOSIS — Y990 Civilian activity done for income or pay: Secondary | ICD-10-CM | POA: Insufficient documentation

## 2020-08-15 DIAGNOSIS — Z041 Encounter for examination and observation following transport accident: Secondary | ICD-10-CM | POA: Insufficient documentation

## 2020-08-15 NOTE — ED Triage Notes (Signed)
Patient ambulatory to triage with steady gait, without difficulty or distress noted; pt reports while on duty; denies any c/o but needs UDS for workers comp; pt employed with [employer]--no workers comp profile on file; pt accomp by Public librarian) who requests UDS to be performed

## 2020-08-16 ENCOUNTER — Emergency Department
Admission: EM | Admit: 2020-08-16 | Discharge: 2020-08-16 | Disposition: A | Discharge status: Home / Self Care | Payer: No Typology Code available for payment source | Attending: Emergency Medicine | Admitting: Emergency Medicine

## 2020-08-16 NOTE — ED Notes (Signed)
Works comp consent form and medication history completed by pt; UDS collected using chain of custody
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
## 2020-11-29 ENCOUNTER — Other Ambulatory Visit: Payer: Self-pay

## 2020-11-29 NOTE — Progress Notes (Signed)
Pt presents today to complete pre-employment drug screen for PO1. CL,RMA

## 2020-12-15 ENCOUNTER — Encounter: Payer: Self-pay | Admitting: Adult Health

## 2020-12-15 ENCOUNTER — Other Ambulatory Visit: Payer: Self-pay

## 2020-12-15 ENCOUNTER — Ambulatory Visit: Payer: Self-pay | Admitting: Adult Health

## 2020-12-15 ENCOUNTER — Ambulatory Visit: Payer: Self-pay

## 2020-12-15 VITALS — BP 126/81 | HR 73 | Temp 97.7°F | Ht 71.0 in | Wt 150.0 lb

## 2020-12-15 DIAGNOSIS — B009 Herpesviral infection, unspecified: Secondary | ICD-10-CM

## 2020-12-15 DIAGNOSIS — Z0000 Encounter for general adult medical examination without abnormal findings: Secondary | ICD-10-CM

## 2020-12-15 MED ORDER — VALACYCLOVIR HCL 1 G PO TABS: ORAL_TABLET | ORAL | 3 refills | Status: DC

## 2020-12-15 NOTE — Progress Notes (Signed)
Requesting a Rx for Valacyclovir to have on hand.  States he's currently out of med.  AMD

## 2020-12-15 NOTE — Progress Notes (Signed)
[facility] [address] [place], [place] [address]  Internal MEDICINE  Office Visit Note  Patient Name: Scott Rivas  [id number]  [id number]  Date of Service: 12/15/2020  Chief Complaint  Patient presents with   Employment Physical    New Hire - Emergency planning/management officer [employer]     HPI Pt is here for routine health maintenance examination.  He is a well appearing 25 yo LEO who is returning for employment here with [employer].  He has been a Insurance underwriter for 5 years.  He is single with no children.  He reports a history of spontaneous pneumothorax when he was 25 yo, and a history of cold sores.  He does not use tobacco or illicit drugs, he does drink alcohol rarely.  He exercises, but not regularly.   Current Medication: Outpatient Encounter Medications as of 12/15/2020  Medication Sig Note   valACYclovir (VALTREX) 1000 MG tablet Take 2 tablets (2,000mg ) by mouth and repeat one dose in 12 hours; start at onset of symptoms    [DISCONTINUED] valACYclovir (VALTREX) 1000 MG tablet Take 2 tablets (2,000mg ) by mouth and repeat one dose in 12 hours; start at onset of symptoms 12/15/2020: Takes prn   No facility-administered encounter medications on file as of 12/15/2020.    Surgical History: Past Surgical History:  Procedure Laterality Date   CYST EXCISION     Left Side of his mid back   WISDOM TOOTH EXTRACTION      Medical History: History reviewed. No pertinent past medical history.  Family History: Family History  Problem Relation Age of Onset   Stroke Mother    Cancer Maternal Grandfather        lung    Social History: Social History   Socioeconomic History   Marital status: Single    Spouse name: Not on file   Number of children: Not on file   Years of education: Not on file   Highest education level: Not on file  Occupational History   Not on file  Tobacco Use   Smoking status: Never   Smokeless tobacco: Never  Substance and Sexual Activity   Alcohol use: Yes    Comment:  social   Drug use: No   Sexual activity: Not on file  Other Topics Concern   Not on file  Social History Narrative   Not on file   Social Determinants of Health   Financial Resource Strain: Not on file  Food Insecurity: Not on file  Transportation Needs: Not on file  Physical Activity: Not on file  Stress: Not on file  Social Connections: Not on file      Review of Systems  Constitutional:  Negative for activity change, appetite change and fatigue.  HENT:  Negative for congestion, sinus pain, trouble swallowing and voice change.   Eyes:  Negative for pain, discharge and visual disturbance.  Respiratory:  Negative for cough, chest tightness and shortness of breath.   Cardiovascular:  Negative for chest pain and leg swelling.  Gastrointestinal:  Negative for abdominal distention, abdominal pain, constipation and diarrhea.  Endocrine: Positive for heat intolerance. Negative for cold intolerance.  Genitourinary:  Negative for difficulty urinating, frequency, genital sores, penile discharge and testicular pain.  Musculoskeletal:  Negative for arthralgias, back pain and neck pain.  Skin:  Negative for color change.  Neurological:  Negative for dizziness, weakness and headaches.  Hematological:  Negative for adenopathy.  Psychiatric/Behavioral:  Negative for agitation, confusion and suicidal ideas.  Vital Signs: BP 126/81   Pulse 73   Temp 97.7 F (36.5 C)   Ht 5\' 11"  (1.803 m)   Wt 150 lb (68 kg)   SpO2 98%   BMI 20.92 kg/m    Physical Exam Constitutional:      Appearance: Normal appearance.  HENT:     Head: Normocephalic.     Right Ear: Tympanic membrane normal.     Left Ear: Tympanic membrane normal.     Nose: Nose normal.     Mouth/Throat:     Mouth: Mucous membranes are moist.     Pharynx: No oropharyngeal exudate or posterior oropharyngeal erythema.  Eyes:     General:        Right eye: No discharge.        Left eye: No discharge.     Extraocular  Movements: Extraocular movements intact.     Pupils: Pupils are equal, round, and reactive to light.  Cardiovascular:     Rate and Rhythm: Normal rate and regular rhythm.     Pulses: Normal pulses.     Heart sounds: Normal heart sounds. No murmur heard. Pulmonary:     Effort: Pulmonary effort is normal. No respiratory distress.     Breath sounds: Normal breath sounds. No wheezing or rhonchi.  Abdominal:     General: Abdomen is flat. Bowel sounds are normal. There is no distension.     Palpations: There is no mass.     Tenderness: There is no abdominal tenderness. There is no guarding.     Hernia: No hernia is present. There is no hernia in the left inguinal area or right inguinal area.  Genitourinary:    Penis: Normal and circumcised. No phimosis, hypospadias or tenderness.      Testes:        Right: Mass, tenderness or swelling not present.        Left: Mass, tenderness or swelling not present.     Epididymis:     Right: Not inflamed or enlarged. No mass or tenderness.     Left: Not inflamed or enlarged. No mass or tenderness.  Musculoskeletal:        General: No swelling or deformity. Normal range of motion.     Cervical back: Normal range of motion.  Lymphadenopathy:     Lower Body: No right inguinal adenopathy. No left inguinal adenopathy.  Skin:    General: Skin is warm and dry.     Capillary Refill: Capillary refill takes less than 2 seconds.  Neurological:     General: No focal deficit present.     Mental Status: He is alert.     Cranial Nerves: No cranial nerve deficit.     Gait: Gait normal.  Psychiatric:        Mood and Affect: Mood normal.        Behavior: Behavior normal.        Judgment: Judgment normal.     LABS: No results found for this or any previous visit (from the past 2160 hour(s)).    Assessment/Plan: 1. Routine adult health maintenance Up to date on PHM.   2. Herpes infection Use Valtrex as directed.  - valACYclovir (VALTREX) 1000 MG tablet;  Take 2 tablets (2,000mg ) by mouth and repeat one dose in 12 hours; start at onset of symptoms  Dispense: 32 tablet; Refill: 3     General Counseling: astor gentle understanding of the findings of todays visit and agrees with plan of treatment. I have  discussed any further diagnostic evaluation that may be needed or ordered today. We also reviewed his medications today. he has been encouraged to call the office with any questions or concerns that should arise related to todays visit.    No orders of the defined types were placed in this encounter.   Meds ordered this encounter  Medications   valACYclovir (VALTREX) 1000 MG tablet    Sig: Take 2 tablets (2,000mg ) by mouth and repeat one dose in 12 hours; start at onset of symptoms    Dispense:  32 tablet    Refill:  3    Total time spent:40 Minutes  Time spent includes review of chart, medications, test results, and follow up plan with the patient.    Johnna Acosta AGNP-C Nurse Practitioner

## 2020-12-15 NOTE — Progress Notes (Signed)
Pt completed labs for pre-employment physical with adam scarboro,NP.

## 2020-12-16 LAB — CMP12+LP+TP+TSH+6AC+CBC/D/PLT
ALT: 11 IU/L (ref 0–44)
AST: 20 IU/L (ref 0–40)
Albumin/Globulin Ratio: 2.2 (ref 1.2–2.2)
Albumin: 4.9 g/dL (ref 4.1–5.2)
Alkaline Phosphatase: 37 IU/L — ABNORMAL LOW (ref 44–121)
BUN/Creatinine Ratio: 18 (ref 9–20)
BUN: 16 mg/dL (ref 6–20)
Basophils Absolute: 0 10*3/uL (ref 0.0–0.2)
Basos: 1 %
Bilirubin Total: 0.4 mg/dL (ref 0.0–1.2)
Calcium: 9.5 mg/dL (ref 8.7–10.2)
Chloride: 102 mmol/L (ref 96–106)
Chol/HDL Ratio: 4.6 ratio (ref 0.0–5.0)
Cholesterol, Total: 179 mg/dL (ref 100–199)
Creatinine, Ser: 0.88 mg/dL (ref 0.76–1.27)
EOS (ABSOLUTE): 0.1 10*3/uL (ref 0.0–0.4)
Eos: 2 %
Estimated CHD Risk: 0.9 times avg. (ref 0.0–1.0)
Free Thyroxine Index: 2.3 (ref 1.2–4.9)
GGT: 13 IU/L (ref 0–65)
Globulin, Total: 2.2 g/dL (ref 1.5–4.5)
Glucose: 72 mg/dL (ref 65–99)
HDL: 39 mg/dL — ABNORMAL LOW (ref >39)
Hematocrit: 45.1 % (ref 37.5–51.0)
Hemoglobin: 16 g/dL (ref 13.0–17.7)
Immature Grans (Abs): 0 10*3/uL (ref 0.0–0.1)
Immature Granulocytes: 0 %
Iron: 78 ug/dL (ref 38–169)
LDH: 164 IU/L (ref 121–224)
LDL Chol Calc (NIH): 103 mg/dL — ABNORMAL HIGH (ref 0–99)
Lymphocytes Absolute: 1.6 10*3/uL (ref 0.7–3.1)
Lymphs: 25 %
MCH: 31.4 pg (ref 26.6–33.0)
MCHC: 35.5 g/dL (ref 31.5–35.7)
MCV: 88 fL (ref 79–97)
Monocytes Absolute: 0.5 10*3/uL (ref 0.1–0.9)
Monocytes: 8 %
Neutrophils Absolute: 4.1 10*3/uL (ref 1.4–7.0)
Neutrophils: 64 %
Phosphorus: 3.4 mg/dL (ref 2.8–4.1)
Platelets: 267 10*3/uL (ref 150–450)
Potassium: 4.1 mmol/L (ref 3.5–5.2)
RBC: 5.1 x10E6/uL (ref 4.14–5.80)
RDW: 11.6 % (ref 11.6–15.4)
Sodium: 141 mmol/L (ref 134–144)
T3 Uptake Ratio: 27 % (ref 24–39)
T4, Total: 8.5 ug/dL (ref 4.5–12.0)
TSH: 1.09 u[IU]/mL (ref 0.450–4.500)
Total Protein: 7.1 g/dL (ref 6.0–8.5)
Triglycerides: 215 mg/dL — ABNORMAL HIGH (ref 0–149)
Uric Acid: 4.6 mg/dL (ref 3.8–8.4)
VLDL Cholesterol Cal: 37 mg/dL (ref 5–40)
WBC: 6.4 10*3/uL (ref 3.4–10.8)
eGFR: 122 mL/min/{1.73_m2} (ref >59)

## 2020-12-19 NOTE — Addendum Note (Signed)
Addended by: Gardner Candle on: 12/19/2020 10:23 AM   Modules accepted: Orders

## 2021-03-27 ENCOUNTER — Other Ambulatory Visit: Payer: Self-pay

## 2021-03-27 ENCOUNTER — Encounter: Payer: Self-pay | Admitting: Physician Assistant

## 2021-03-27 ENCOUNTER — Ambulatory Visit: Payer: 59 | Admitting: Physician Assistant

## 2021-03-27 VITALS — BP 120/78 | HR 95 | Temp 98.7°F | Resp 14 | Ht 71.0 in | Wt 146.0 lb

## 2021-03-27 DIAGNOSIS — M25531 Pain in right wrist: Secondary | ICD-10-CM

## 2021-03-27 NOTE — Progress Notes (Signed)
   Subjective: Bilateral wrist pain    Patient ID: Scott Rivas, male    DOB: [birth year], 25 y.o.   MRN: [id number]  HPI Patient presents with bilateral wrist pain, right greater than left for approximate 4 years.  Patient states 4 years ago he had left wrist sprain.  No other injuries to the bilateral wrist.  Patient states pain increases with extension of the wrist especially when doing push-ups.  Patient denies loss sensation or loss of function.  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  Patient states pain limits his physical training.   Review of Systems Negative except for complaint no acute distress.     Objective:   Physical Exam  No acute distress.  Temperature 98.7, pulse 95, respiration 14, BP is 128/78, patient 90% O2 sat on room air.  Patient weighs 146 pounds and BMI is 20.36. Patient is right-hand dominant.  No obvious deformity to the bilateral wrist.  Patient has full and equal range of motion.  Small palpable nodule lesion dorsal aspect of right wrist.  No corresponding lesion on the left wrist.  Strength is 5/5.     Assessment & Plan: Bilateral wrist pain   Pati patient was given bilateral elastic wrist support.  Patient will be consulted to orthopedic for definitive evaluation and treatment.  Advised over-the-counter anti-inflammatory as needed.

## 2021-03-27 NOTE — Progress Notes (Signed)
Pt had xray 07/09/2016 (in chart)  Pt sprained left wrist in 2016. So intermittent pain from then till now.   Right wrist has been hurting since 2018 intermittent due to getting ready for training at work.   Pt states it hurts when pressure is applied to both wrists not movement.

## 2021-04-11 DIAGNOSIS — Z20822 Contact with and (suspected) exposure to covid-19: Secondary | ICD-10-CM | POA: Diagnosis not present

## 2021-04-11 DIAGNOSIS — Z03818 Encounter for observation for suspected exposure to other biological agents ruled out: Secondary | ICD-10-CM | POA: Diagnosis not present

## 2021-05-01 ENCOUNTER — Encounter: Payer: Self-pay | Admitting: Physician Assistant

## 2021-05-01 ENCOUNTER — Other Ambulatory Visit: Payer: Self-pay

## 2021-05-01 ENCOUNTER — Ambulatory Visit: Payer: Self-pay | Admitting: Physician Assistant

## 2021-05-01 VITALS — BP 123/91 | HR 86 | Temp 98.7°F | Resp 12 | Ht 70.0 in | Wt 150.0 lb

## 2021-05-01 DIAGNOSIS — R197 Diarrhea, unspecified: Secondary | ICD-10-CM

## 2021-05-01 NOTE — Progress Notes (Signed)
Pt has been having stomach pain and diarrhea for 2 weeks. As of today its ok he has episodes of having diarrhea with the stomach pain. Hes never had the sharp pains before. Having issues with intermittent diarrhea for about a year/CL,RMA

## 2021-05-01 NOTE — Progress Notes (Signed)
   Subjective: Abdominal pain and diarrhea    Patient ID: Scott Rivas, male    DOB: Jul 28, 1995, 25 y.o.   MRN: [id number]  HPI Patient complain of right upper abdominal pain and diarrhea for greater than 6 weeks.  Patient states greater than a month ago he started noticing loose stools, watery stool, abdominal pain.  Patient states 3 weeks ago he went to to [place] and ate a lot of the local food.  Patient noted increased frequency of diarrhea upon return to the [place].  Patient states diarrhea is controlled with over-the-counter Imodium.  States right upper abdominal pain with defecation.  Denies blood in stool tissue paper.  Denies nausea or vomiting.  Review of Systems Negative except for complaint    Objective:   Physical Exam No acute distress.  Temperature 98.7, pulse 86, respiration 12, BP is 123/91, patient weighs 150 pounds and BMI is 21.52. Negative HSM, normoactive bowel sounds, mild guarding palpation right upper quadrant.       Assessment & Plan: Diarrhea.   Advised patient that further assessment will be done with labs to include stool O&P.  Patient will follow-up in 2 days.  We will consider GI consult.  Advised to continue Imodium as needed.

## 2021-05-02 LAB — COMPREHENSIVE METABOLIC PANEL
ALT: 13 IU/L (ref 0–44)
AST: 13 IU/L (ref 0–40)
Albumin/Globulin Ratio: 2 (ref 1.2–2.2)
Albumin: 4.9 g/dL (ref 4.1–5.2)
Alkaline Phosphatase: 39 IU/L — ABNORMAL LOW (ref 44–121)
BUN/Creatinine Ratio: 15 (ref 9–20)
BUN: 12 mg/dL (ref 6–20)
Bilirubin Total: 0.3 mg/dL (ref 0.0–1.2)
CO2: 23 mmol/L (ref 20–29)
Calcium: 9.7 mg/dL (ref 8.7–10.2)
Chloride: 103 mmol/L (ref 96–106)
Creatinine, Ser: 0.79 mg/dL (ref 0.76–1.27)
Globulin, Total: 2.5 g/dL (ref 1.5–4.5)
Glucose: 88 mg/dL (ref 70–99)
Potassium: 4.1 mmol/L (ref 3.5–5.2)
Sodium: 141 mmol/L (ref 134–144)
Total Protein: 7.4 g/dL (ref 6.0–8.5)
eGFR: 126 mL/min/{1.73_m2} (ref >59)

## 2021-05-02 LAB — AMYLASE: Amylase: 53 U/L (ref 31–110)

## 2021-05-04 ENCOUNTER — Ambulatory Visit: Payer: Self-pay | Admitting: Physician Assistant

## 2021-05-04 ENCOUNTER — Encounter: Payer: Self-pay | Admitting: Physician Assistant

## 2021-05-04 ENCOUNTER — Other Ambulatory Visit: Payer: Self-pay

## 2021-05-04 VITALS — BP 120/70 | HR 74 | Temp 98.4°F | Wt 151.0 lb

## 2021-05-04 DIAGNOSIS — R197 Diarrhea, unspecified: Secondary | ICD-10-CM

## 2021-05-04 DIAGNOSIS — S76211A Strain of adductor muscle, fascia and tendon of right thigh, initial encounter: Secondary | ICD-10-CM

## 2021-05-04 NOTE — Progress Notes (Signed)
No BM since Monday - hasn't eaten anything today.  Typically when he works, he has BM in the mornings & especially after drinking coffee.  States no longer having abdominal pain    Separate issue: Right inner leg groin pain - going on 5-6 days Feels with certain movements - intermittent States sharp pain - not unbearable Took IBU & it didn't help No known injjury  AMD

## 2021-05-04 NOTE — Progress Notes (Signed)
   Subjective: Abdominal pain and diarrhea    Patient ID: Scott Rivas, male    DOB: Apr 09, 1996, 25 y.o.   MRN: [id number]  HPI Patient is follow-up for approxi-6 weeks of right upper quadrant pain and diarrhea.  Patient was seen 2 days ago and was told to bring in stool samples and follow-up for CMP lab results.  Patient states since leaving the clinic 2 days ago no abdominal pain or diarrhea .  Patient is not taking antidiarrhea medication..  Patient also wished to address approximate 1 week of right inguinal pain.  Patient denies heavy lifting.  Denies provocative measures for complaint.  States pain is intermittent.   Review of Systems Abdominal pain, diarrhea, and right ankle pain.    Objective:   Physical Exam Patient appears no acute distress.  Temperature 98.4, pulse 74, BP is 120/70, patient 97% O2 sat on room air.  Patient weighs 151 pounds. HEENT is unremarkable.  Abdomen with negative HSM, normoactive bowel sounds, soft, non-tender to palpation.  No palpable inguinal mass.        Assessment & Plan: Abdominal pain, diarrhea, right inguinal pain.   Advised patient no acute findings on CMP.  If complaint persist will consider ultrasound.  Advised to follow-up in 4 days.

## 2021-05-05 ENCOUNTER — Other Ambulatory Visit: Payer: 59

## 2021-05-05 DIAGNOSIS — R197 Diarrhea, unspecified: Secondary | ICD-10-CM

## 2021-05-05 NOTE — Progress Notes (Signed)
Pt dropped off stool sample for o&p culture, per ron smith,pa-c

## 2021-05-09 LAB — GIARDIA, EIA; OVA/PARASITE: Giardia Ag, Stl: NEGATIVE

## 2021-08-15 ENCOUNTER — Other Ambulatory Visit: Payer: Self-pay | Admitting: Physician Assistant

## 2021-08-15 ENCOUNTER — Other Ambulatory Visit: Payer: Self-pay

## 2021-08-15 ENCOUNTER — Encounter: Payer: Self-pay | Admitting: Physician Assistant

## 2021-08-15 ENCOUNTER — Ambulatory Visit: Payer: Self-pay | Admitting: Physician Assistant

## 2021-08-15 DIAGNOSIS — J029 Acute pharyngitis, unspecified: Secondary | ICD-10-CM

## 2021-08-15 DIAGNOSIS — R519 Headache, unspecified: Secondary | ICD-10-CM

## 2021-08-15 DIAGNOSIS — R051 Acute cough: Secondary | ICD-10-CM

## 2021-08-15 DIAGNOSIS — U071 COVID-19: Secondary | ICD-10-CM

## 2021-08-15 DIAGNOSIS — R509 Fever, unspecified: Secondary | ICD-10-CM

## 2021-08-15 LAB — POCT INFLUENZA A/B
Influenza A, POC: NEGATIVE
Influenza B, POC: NEGATIVE

## 2021-08-15 LAB — POCT RAPID STREP A (OFFICE): Rapid Strep A Screen: NEGATIVE

## 2021-08-15 LAB — POC COVID19 BINAXNOW: SARS Coronavirus 2 Ag: POSITIVE — AB

## 2021-08-15 MED ORDER — PSEUDOEPH-BROMPHEN-DM 30-2-10 MG/5ML PO SYRP: 5.0000 mL | ORAL_SOLUTION | Freq: Four times a day (QID) | ORAL | 0 refills | Status: DC | PRN

## 2021-08-15 MED ORDER — LIDOCAINE VISCOUS HCL 2 % MT SOLN: 5.0000 mL | Freq: Four times a day (QID) | OROMUCOSAL | 0 refills | Status: DC | PRN

## 2021-08-15 NOTE — Progress Notes (Signed)
°

## 2021-08-15 NOTE — Progress Notes (Signed)
° °  Subjective: Sore throat and cough    Patient ID: Scott Rivas, male    DOB: 1996-04-26, 26 y.o.   MRN: [id number]  HPI Patient complain of sore throat and cough for 3 days.  Also states low-grade fever.  Able to tolerate food and fluids with mild discomfort.  Denies recent travel or known contact with COVID-19.  Patient tested positive for COVID-19 rapid test today.   Review of Systems Negative septa chief complaint    Objective:   Physical Exam  This is a virtual visit.      Assessment & Plan: COVID-19   Patient given discharge care instruction and prescription for Bromfed-DM to mix with viscous lidocaine for swish and swallow.  Follow-up in 2 days if no improvement or worsening complaint.

## 2021-08-15 NOTE — Progress Notes (Signed)
° °  Subjective: Sore throat and cough    Patient ID: Scott Rivas, male    DOB: June 12, 1996, 26 y.o.   MRN: [id number]  HPI Patient complain of 3 days of sore throat, cough, and low-grade fever.  Denies recent travel or known contact with COVID-19.  Patient test positive COVID 19 on rapid test.  Strep test was negative.   Review of Systems Negative septa chief complaint    Objective:   Physical Exam This is a virtual visit.       Assessment & Plan: [id number]   Patient given discharge care instruction and a prescription for Bromfed-DM to mix with viscous lidocaine for swish and swallow.  Follow-up if no improvement in 2 to 3 days.

## 2021-08-15 NOTE — Progress Notes (Addendum)
°  S/Sx started Saturday: Body aches Fever - Highest temp was 100.3 Fatigue Headache Sore throat  Cough  Rapid Covid Test at clinic today = Positive  Sore throat is worst symptom now.  Hurts to swallow.  Able to drink fluids & eating solid foods, but says it hurts. Productive cough - dark 1st thing in the morning & yellowish the rest of the day.  Taking OTC Mucinex & Cold/Flu medication.  Would like to talk to Ron about something for sore throat & cough  AMD

## 2021-08-17 ENCOUNTER — Other Ambulatory Visit: Payer: Self-pay | Admitting: Physician Assistant

## 2021-08-17 MED ORDER — METHYLPREDNISOLONE 4 MG PO TBPK: ORAL_TABLET | ORAL | 0 refills | Status: DC

## 2021-11-16 DIAGNOSIS — M9903 Segmental and somatic dysfunction of lumbar region: Secondary | ICD-10-CM | POA: Diagnosis not present

## 2021-11-16 DIAGNOSIS — M5416 Radiculopathy, lumbar region: Secondary | ICD-10-CM | POA: Diagnosis not present

## 2021-11-16 DIAGNOSIS — M9902 Segmental and somatic dysfunction of thoracic region: Secondary | ICD-10-CM | POA: Diagnosis not present

## 2021-11-16 DIAGNOSIS — M25512 Pain in left shoulder: Secondary | ICD-10-CM | POA: Diagnosis not present

## 2021-11-16 DIAGNOSIS — M25511 Pain in right shoulder: Secondary | ICD-10-CM | POA: Diagnosis not present

## 2021-11-16 DIAGNOSIS — M546 Pain in thoracic spine: Secondary | ICD-10-CM | POA: Diagnosis not present

## 2021-11-21 DIAGNOSIS — M546 Pain in thoracic spine: Secondary | ICD-10-CM | POA: Diagnosis not present

## 2021-11-21 DIAGNOSIS — M25511 Pain in right shoulder: Secondary | ICD-10-CM | POA: Diagnosis not present

## 2021-11-21 DIAGNOSIS — M25512 Pain in left shoulder: Secondary | ICD-10-CM | POA: Diagnosis not present

## 2021-11-21 DIAGNOSIS — M9903 Segmental and somatic dysfunction of lumbar region: Secondary | ICD-10-CM | POA: Diagnosis not present

## 2021-11-21 DIAGNOSIS — M9902 Segmental and somatic dysfunction of thoracic region: Secondary | ICD-10-CM | POA: Diagnosis not present

## 2021-11-21 DIAGNOSIS — M5416 Radiculopathy, lumbar region: Secondary | ICD-10-CM | POA: Diagnosis not present

## 2021-11-24 DIAGNOSIS — M5416 Radiculopathy, lumbar region: Secondary | ICD-10-CM | POA: Diagnosis not present

## 2021-11-24 DIAGNOSIS — M25512 Pain in left shoulder: Secondary | ICD-10-CM | POA: Diagnosis not present

## 2021-11-24 DIAGNOSIS — M546 Pain in thoracic spine: Secondary | ICD-10-CM | POA: Diagnosis not present

## 2021-11-24 DIAGNOSIS — M9903 Segmental and somatic dysfunction of lumbar region: Secondary | ICD-10-CM | POA: Diagnosis not present

## 2021-11-24 DIAGNOSIS — M25511 Pain in right shoulder: Secondary | ICD-10-CM | POA: Diagnosis not present

## 2021-11-24 DIAGNOSIS — M9902 Segmental and somatic dysfunction of thoracic region: Secondary | ICD-10-CM | POA: Diagnosis not present

## 2021-11-29 DIAGNOSIS — M25511 Pain in right shoulder: Secondary | ICD-10-CM | POA: Diagnosis not present

## 2021-11-29 DIAGNOSIS — M25512 Pain in left shoulder: Secondary | ICD-10-CM | POA: Diagnosis not present

## 2021-11-29 DIAGNOSIS — M546 Pain in thoracic spine: Secondary | ICD-10-CM | POA: Diagnosis not present

## 2021-11-29 DIAGNOSIS — M9903 Segmental and somatic dysfunction of lumbar region: Secondary | ICD-10-CM | POA: Diagnosis not present

## 2021-11-29 DIAGNOSIS — M5416 Radiculopathy, lumbar region: Secondary | ICD-10-CM | POA: Diagnosis not present

## 2021-11-29 DIAGNOSIS — M9902 Segmental and somatic dysfunction of thoracic region: Secondary | ICD-10-CM | POA: Diagnosis not present

## 2021-12-04 DIAGNOSIS — M9902 Segmental and somatic dysfunction of thoracic region: Secondary | ICD-10-CM | POA: Diagnosis not present

## 2021-12-04 DIAGNOSIS — M25511 Pain in right shoulder: Secondary | ICD-10-CM | POA: Diagnosis not present

## 2021-12-04 DIAGNOSIS — M5416 Radiculopathy, lumbar region: Secondary | ICD-10-CM | POA: Diagnosis not present

## 2021-12-04 DIAGNOSIS — M25512 Pain in left shoulder: Secondary | ICD-10-CM | POA: Diagnosis not present

## 2021-12-04 DIAGNOSIS — M546 Pain in thoracic spine: Secondary | ICD-10-CM | POA: Diagnosis not present

## 2021-12-04 DIAGNOSIS — M9903 Segmental and somatic dysfunction of lumbar region: Secondary | ICD-10-CM | POA: Diagnosis not present

## 2021-12-07 DIAGNOSIS — M9902 Segmental and somatic dysfunction of thoracic region: Secondary | ICD-10-CM | POA: Diagnosis not present

## 2021-12-07 DIAGNOSIS — M25512 Pain in left shoulder: Secondary | ICD-10-CM | POA: Diagnosis not present

## 2021-12-07 DIAGNOSIS — M5416 Radiculopathy, lumbar region: Secondary | ICD-10-CM | POA: Diagnosis not present

## 2021-12-07 DIAGNOSIS — M546 Pain in thoracic spine: Secondary | ICD-10-CM | POA: Diagnosis not present

## 2021-12-07 DIAGNOSIS — M25511 Pain in right shoulder: Secondary | ICD-10-CM | POA: Diagnosis not present

## 2021-12-07 DIAGNOSIS — M9903 Segmental and somatic dysfunction of lumbar region: Secondary | ICD-10-CM | POA: Diagnosis not present

## 2022-02-22 ENCOUNTER — Other Ambulatory Visit: Payer: Self-pay

## 2022-02-22 DIAGNOSIS — Z0000 Encounter for general adult medical examination without abnormal findings: Secondary | ICD-10-CM

## 2022-02-22 LAB — POCT URINALYSIS DIPSTICK
Bilirubin, UA: NEGATIVE
Blood, UA: NEGATIVE
Glucose, UA: NEGATIVE
Ketones, UA: NEGATIVE
Leukocytes, UA: NEGATIVE
Nitrite, UA: NEGATIVE
Protein, UA: POSITIVE — AB
Spec Grav, UA: >=1.03 — AB (ref 1.010–1.025)
Urobilinogen, UA: 0.2 E.U./dL
pH, UA: 5.5 (ref 5.0–8.0)

## 2022-02-22 NOTE — Progress Notes (Signed)
Pt presents to complete physical labs for Dr.Williams,MD

## 2022-02-23 LAB — CMP12+LP+TP+TSH+6AC+CBC/D/PLT
ALT: 27 IU/L (ref 0–44)
AST: 24 IU/L (ref 0–40)
Albumin/Globulin Ratio: 1.7 (ref 1.2–2.2)
Albumin: 5.1 g/dL (ref 4.3–5.2)
Alkaline Phosphatase: 34 IU/L — ABNORMAL LOW (ref 44–121)
BUN/Creatinine Ratio: 14 (ref 9–20)
BUN: 14 mg/dL (ref 6–20)
Basophils Absolute: 0 10*3/uL (ref 0.0–0.2)
Basos: 1 %
Bilirubin Total: 0.4 mg/dL (ref 0.0–1.2)
Calcium: 10.5 mg/dL — ABNORMAL HIGH (ref 8.7–10.2)
Chloride: 102 mmol/L (ref 96–106)
Chol/HDL Ratio: 11.4 ratio — ABNORMAL HIGH (ref 0.0–5.0)
Cholesterol, Total: 240 mg/dL — ABNORMAL HIGH (ref 100–199)
Creatinine, Ser: 1.03 mg/dL (ref 0.76–1.27)
EOS (ABSOLUTE): 0.2 10*3/uL (ref 0.0–0.4)
Eos: 4 %
Estimated CHD Risk: 2.1 times avg. — ABNORMAL HIGH (ref 0.0–1.0)
Free Thyroxine Index: 2 (ref 1.2–4.9)
GGT: 14 IU/L (ref 0–65)
Globulin, Total: 3 g/dL (ref 1.5–4.5)
Glucose: 83 mg/dL (ref 70–99)
HDL: 21 mg/dL — ABNORMAL LOW (ref >39)
Hematocrit: 51.5 % — ABNORMAL HIGH (ref 37.5–51.0)
Hemoglobin: 17.2 g/dL (ref 13.0–17.7)
Immature Grans (Abs): 0 10*3/uL (ref 0.0–0.1)
Immature Granulocytes: 0 %
Iron: 94 ug/dL (ref 38–169)
LDH: 196 IU/L (ref 121–224)
LDL Chol Calc (NIH): 196 mg/dL — ABNORMAL HIGH (ref 0–99)
Lymphocytes Absolute: 1.9 10*3/uL (ref 0.7–3.1)
Lymphs: 36 %
MCH: 30.6 pg (ref 26.6–33.0)
MCHC: 33.4 g/dL (ref 31.5–35.7)
MCV: 92 fL (ref 79–97)
Monocytes Absolute: 0.5 10*3/uL (ref 0.1–0.9)
Monocytes: 9 %
Neutrophils Absolute: 2.8 10*3/uL (ref 1.4–7.0)
Neutrophils: 50 %
Phosphorus: 3.8 mg/dL (ref 2.8–4.1)
Platelets: 296 10*3/uL (ref 150–450)
Potassium: 5.1 mmol/L (ref 3.5–5.2)
RBC: 5.62 x10E6/uL (ref 4.14–5.80)
RDW: 13.2 % (ref 11.6–15.4)
Sodium: 145 mmol/L — ABNORMAL HIGH (ref 134–144)
T3 Uptake Ratio: 26 % (ref 24–39)
T4, Total: 7.7 ug/dL (ref 4.5–12.0)
TSH: 2.28 u[IU]/mL (ref 0.450–4.500)
Total Protein: 8.1 g/dL (ref 6.0–8.5)
Triglycerides: 123 mg/dL (ref 0–149)
Uric Acid: 3.9 mg/dL (ref 3.8–8.4)
VLDL Cholesterol Cal: 23 mg/dL (ref 5–40)
WBC: 5.4 10*3/uL (ref 3.4–10.8)
eGFR: 102 mL/min/{1.73_m2} (ref >59)

## 2022-06-04 ENCOUNTER — Other Ambulatory Visit: Payer: Self-pay

## 2022-06-04 DIAGNOSIS — Z0000 Encounter for general adult medical examination without abnormal findings: Secondary | ICD-10-CM

## 2022-06-04 NOTE — Progress Notes (Signed)
Pr presents today to complete follow up labs.

## 2022-06-05 LAB — CMP12+LP+TP+TSH+6AC+CBC/D/PLT
ALT: 27 IU/L (ref 0–44)
AST: 14 IU/L (ref 0–40)
Albumin/Globulin Ratio: 1.6 (ref 1.2–2.2)
Albumin: 4.7 g/dL (ref 4.3–5.2)
Alkaline Phosphatase: 44 IU/L (ref 44–121)
BUN/Creatinine Ratio: 19 (ref 9–20)
BUN: 19 mg/dL (ref 6–20)
Basophils Absolute: 0 10*3/uL (ref 0.0–0.2)
Basos: 1 %
Bilirubin Total: 0.3 mg/dL (ref 0.0–1.2)
Calcium: 10 mg/dL (ref 8.7–10.2)
Chloride: 102 mmol/L (ref 96–106)
Chol/HDL Ratio: 5.7 ratio — ABNORMAL HIGH (ref 0.0–5.0)
Cholesterol, Total: 210 mg/dL — ABNORMAL HIGH (ref 100–199)
Creatinine, Ser: 1 mg/dL (ref 0.76–1.27)
EOS (ABSOLUTE): 0.2 10*3/uL (ref 0.0–0.4)
Eos: 4 %
Estimated CHD Risk: 1.2 times avg. — ABNORMAL HIGH (ref 0.0–1.0)
Free Thyroxine Index: 2.2 (ref 1.2–4.9)
GGT: 18 IU/L (ref 0–65)
Globulin, Total: 2.9 g/dL (ref 1.5–4.5)
Glucose: 87 mg/dL (ref 70–99)
HDL: 37 mg/dL — ABNORMAL LOW (ref >39)
Hematocrit: 53 % — ABNORMAL HIGH (ref 37.5–51.0)
Hemoglobin: 18.1 g/dL — ABNORMAL HIGH (ref 13.0–17.7)
Immature Grans (Abs): 0 10*3/uL (ref 0.0–0.1)
Immature Granulocytes: 0 %
Iron: 69 ug/dL (ref 38–169)
LDH: 170 IU/L (ref 121–224)
LDL Chol Calc (NIH): 141 mg/dL — ABNORMAL HIGH (ref 0–99)
Lymphocytes Absolute: 2.3 10*3/uL (ref 0.7–3.1)
Lymphs: 42 %
MCH: 30.5 pg (ref 26.6–33.0)
MCHC: 34.2 g/dL (ref 31.5–35.7)
MCV: 89 fL (ref 79–97)
Monocytes Absolute: 0.4 10*3/uL (ref 0.1–0.9)
Monocytes: 6 %
Neutrophils Absolute: 2.6 10*3/uL (ref 1.4–7.0)
Neutrophils: 47 %
Phosphorus: 4.6 mg/dL — ABNORMAL HIGH (ref 2.8–4.1)
Platelets: 268 10*3/uL (ref 150–450)
Potassium: 4.2 mmol/L (ref 3.5–5.2)
RBC: 5.94 x10E6/uL — ABNORMAL HIGH (ref 4.14–5.80)
RDW: 12.8 % (ref 11.6–15.4)
Sodium: 143 mmol/L (ref 134–144)
T3 Uptake Ratio: 27 % (ref 24–39)
T4, Total: 8.1 ug/dL (ref 4.5–12.0)
TSH: 3.87 u[IU]/mL (ref 0.450–4.500)
Total Protein: 7.6 g/dL (ref 6.0–8.5)
Triglycerides: 174 mg/dL — ABNORMAL HIGH (ref 0–149)
Uric Acid: 4.7 mg/dL (ref 3.8–8.4)
VLDL Cholesterol Cal: 32 mg/dL (ref 5–40)
WBC: 5.6 10*3/uL (ref 3.4–10.8)
eGFR: 106 mL/min/{1.73_m2} (ref >59)

## 2022-06-27 ENCOUNTER — Ambulatory Visit: Payer: Self-pay | Admitting: Physician Assistant

## 2022-06-27 VITALS — BP 128/84 | HR 97 | Temp 98.0°F | Resp 14 | Ht 71.0 in | Wt 165.0 lb

## 2022-06-27 DIAGNOSIS — J9801 Acute bronchospasm: Secondary | ICD-10-CM

## 2022-06-27 MED ORDER — METHYLPREDNISOLONE 4 MG PO TBPK: ORAL_TABLET | ORAL | 0 refills | Status: DC

## 2022-06-27 MED ORDER — HYDROCODONE BIT-HOMATROP MBR 5-1.5 MG/5ML PO SOLN: 5.0000 mL | Freq: Three times a day (TID) | ORAL | 0 refills | Status: DC | PRN

## 2022-06-27 NOTE — Progress Notes (Signed)
Pt presents today for coughing x 1 month. Otc medications are not working 2 weeks of medication.
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
## 2022-07-04 ENCOUNTER — Ambulatory Visit: Payer: Self-pay | Admitting: Physician Assistant

## 2022-07-04 ENCOUNTER — Encounter: Payer: Self-pay | Admitting: Physician Assistant

## 2022-07-04 VITALS — BP 130/83 | HR 84 | Temp 98.6°F | Resp 12

## 2022-07-04 DIAGNOSIS — R059 Cough, unspecified: Secondary | ICD-10-CM

## 2022-07-04 MED ORDER — HYDROCOD POLI-CHLORPHE POLI ER 10-8 MG/5ML PO SUER: 5.0000 mL | Freq: Every evening | ORAL | 0 refills | Status: DC | PRN

## 2022-07-04 MED ORDER — BENZONATATE 200 MG PO CAPS: 200.0000 mg | ORAL_CAPSULE | Freq: Three times a day (TID) | ORAL | 0 refills | Status: DC | PRN

## 2022-07-04 NOTE — Progress Notes (Signed)
Pt presents today for follow up on cough x 1 month. Pt states cough still there and pt states he will cough up mucus but not a lot through out the day and worse at night.

## 2022-07-04 NOTE — Progress Notes (Addendum)
   Subjective: Cough    Patient ID: Scott Rivas, male    DOB: 06-10-1996, 27 y.o.   MRN: [id number]  HPI Patient complains of continued cough x 1 week.  Patient was seen on 06/27/2022 and given a prescription for Hycodan.  Patient states pharmacy did not have the medication.  Patient states intermitting productive/nonproductive cough during the day.  Cough worsens at night and interfere with sleeping.  Denies recent travel or known contact with COVID-19.   Review of Systems Spontaneous pneumothorax    Objective:   Physical Exam  BP is 130/83, pulse 84, respiration 12, temperature 90.6. HEENT is unremarkable. Neck is supple followed lymphadenectomy or bruits. Lungs are clear to auscultation but increased cough with deep inspirations. Heart regular rate and rhythm.      Assessment & Plan: Chronic cough   Patient given a prescription for Tessalon Perles to take during the day and Tussionex 5 mL to take at night before sleeping.  Follow-up if no improvement or condition worsens.

## 2022-09-11 ENCOUNTER — Ambulatory Visit
Admission: EM | Admit: 2022-09-11 | Discharge: 2022-09-11 | Disposition: A | Discharge status: Home / Self Care | Payer: 59 | Attending: Emergency Medicine | Admitting: Emergency Medicine

## 2022-09-11 DIAGNOSIS — S0922XA Traumatic rupture of left ear drum, initial encounter: Secondary | ICD-10-CM | POA: Diagnosis not present

## 2022-09-11 MED ORDER — NEOMYCIN-POLYMYXIN-HC 3.5-10000-1 OT SUSP: 4.0000 [drp] | Freq: Three times a day (TID) | OTIC | 0 refills | Status: DC

## 2022-09-11 NOTE — ED Provider Notes (Signed)
[facility]    CSN: [id number] Arrival date & time: 09/11/22  1914      History   Chief Complaint Chief Complaint  Patient presents with   Ear Injury    LT ear    HPI Scott Rivas is a 27 y.o. male.   HPI  27 year old male who has a past medical history that significant for spontaneous pneumothorax presenting for evaluation of injury to the left ear with a Q-tip.  He states that he stuck a Q-tip in too far and is worried he punctured his eardrum.  History reviewed. No pertinent past medical history.  Patient Active Problem List   Diagnosis Date Noted   Spontaneous pneumothorax 11/06/2016    Past Surgical History:  Procedure Laterality Date   CYST EXCISION     Left Side of his mid back   [place] Medications    Prior to Admission medications   Medication Sig Start Date End Date Taking? Authorizing Provider  neomycin-polymyxin-hydrocortisone (CORTISPORIN) 3.5-10000-1 OTIC suspension Place 4 drops into the left ear 3 (three) times daily. 09/11/22  Yes Margarette Canada, NP    Family History Family History  Problem Relation Age of Onset   Stroke Mother    Cancer Maternal Grandfather        lung    Social History Social History   Tobacco Use   Smoking status: Never   Smokeless tobacco: Never  Substance Use Topics   Alcohol use: Yes    Comment: social   Drug use: No     Allergies   Patient has no known allergies.   Review of Systems Review of Systems  HENT:  Positive for ear discharge, ear pain and hearing loss.        Decreased hearing and bleeding from the left ear.     Physical Exam Triage Vital Signs ED Triage Vitals [09/11/22 1921]  Enc Vitals Group     BP      Pulse      Resp      Temp      Temp src      SpO2      Weight      Height      Head Circumference      Peak Flow      Pain Score 5     Pain Loc      Pain Edu?      Excl. in [place]?    No data found.  Updated Vital Signs BP (!)  146/92 (BP Location: Left Arm)   Pulse 84   Temp 98.1 F (36.7 C) (Oral)   Ht 5\' 11"  (1.803 m)   Wt 170 lb (77.1 kg)   SpO2 99%   BMI 23.71 kg/m   Visual Acuity Right Eye Distance:   Left Eye Distance:   Bilateral Distance:    Right Eye Near:   Left Eye Near:    Bilateral Near:     Physical Exam Vitals and nursing note reviewed.  Constitutional:      Appearance: Normal appearance. He is not ill-appearing.  HENT:     Head: Normocephalic and atraumatic.     Ears:     Comments: There is a visible perforation in the posterior aspect of the left tympanic membrane with some frank blood in the canal but no active bleeding from the ear canal. Neurological:     Mental Status: He is alert.  UC Treatments / Results  Labs (all labs ordered are listed, but only abnormal results are displayed) Labs Reviewed - No data to display  EKG   Radiology No results found.  Procedures Procedures (including critical care time)  Medications Ordered in UC Medications - No data to display  Initial Impression / Assessment and Plan / UC Course  I have reviewed the triage vital signs and the nursing notes.  Pertinent labs & imaging results that were available during my care of the patient were reviewed by me and considered in my medical decision making (see chart for details).   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  On exam the tympanic membrane is erythematous and there is a visible puncture to the posterior aspect of the TM with some frank blood in the ear canal but no active bleeding from the ear canal.  Will discharge patient home on Cortisporin otic and have him instill 5 drops in his left ear thrice daily for comfort and to prevent infection.  He should wear a silicone ear plug in the shower and when in water to prevent entry into  the ear to cause further injury or infection.  He does have a PCP and I have advised him to follow-up with his PCP in the approximately 4 to 6 weeks for reevaluation to make sure that the eardrum is healing.  He is questioning as to whether or not will affect his hearing and I have advised him that it is going to depend on how much scar tissue there is after it heals and that he may need a hearing test if his hearing does not return to normal.   Final Clinical Impressions(s) / UC Diagnoses   Final diagnoses:  Traumatic perforation of tympanic membrane, left, initial encounter     Discharge Instructions      You have perforated your tympanic membrane.  This will have to heal on its own and generally takes 4 to 6 weeks.  You need to protect it from further injury and also infection.  Please instill 4 drops of Cortisporin otic into your left ear 3 times a day to prevent infection.  You will do this for 5 days.  Wear a silicone ear plug when you are in the shower or when swimming to prevent water entry into the ear that could cause increased pain and also subsequent infection.  You will need to follow-up with your primary care provider in 4 to 6 weeks for reevaluation to ensure that your eardrum has healed.  If the hole is still present you will need a referral to ear nose and throat for further evaluation and treatment.  You can use over-the-counter Tylenol and/or ibuprofen according to the package instructions as needed for pain.  Return for reevaluation for any increased pain or other symptoms.     ED Prescriptions     Medication Sig Dispense Auth. Provider   neomycin-polymyxin-hydrocortisone (CORTISPORIN) 3.5-10000-1 OTIC suspension Place 4 drops into the left ear 3 (three) times daily. 10 mL Margarette Canada, NP      PDMP not reviewed this encounter.   Margarette Canada, NP 09/11/22 1934

## 2022-09-11 NOTE — Discharge Instructions (Addendum)
You have perforated your tympanic membrane.  This will have to heal on its own and generally takes 4 to 6 weeks.  You need to protect it from further injury and also infection.  Please instill 4 drops of Cortisporin otic into your left ear 3 times a day to prevent infection.  You will do this for 5 days.  Wear a silicone ear plug when you are in the shower or when swimming to prevent water entry into the ear that could cause increased pain and also subsequent infection.  You will need to follow-up with your primary care provider in 4 to 6 weeks for reevaluation to ensure that your eardrum has healed.  If the hole is still present you will need a referral to ear nose and throat for further evaluation and treatment.  You can use over-the-counter Tylenol and/or ibuprofen according to the package instructions as needed for pain.  Return for reevaluation for any increased pain or other symptoms.

## 2022-09-11 NOTE — ED Triage Notes (Signed)
Pt presents to UC c/o LT ear injury. Pt states he stuck a q-tip in too far. Pt states he had multiple in his hand and he tried to catch them before they fell and then punctured his ear.

## 2022-09-24 ENCOUNTER — Ambulatory Visit: Payer: Self-pay | Admitting: Physician Assistant

## 2022-09-24 ENCOUNTER — Encounter: Payer: Self-pay | Admitting: Physician Assistant

## 2022-09-24 VITALS — BP 137/94 | HR 72 | Temp 98.8°F | Resp 12 | Ht 72.0 in | Wt 170.0 lb

## 2022-09-24 DIAGNOSIS — H6592 Unspecified nonsuppurative otitis media, left ear: Secondary | ICD-10-CM

## 2022-09-24 NOTE — Progress Notes (Signed)
Pt presents today to for Ed F/u on punctured ear drum Lft ear. Pt states he using drops as directed, still hears ringing and sensation of water in ears.

## 2022-09-24 NOTE — Progress Notes (Signed)
   Subjective: Puncture left eardrum    Patient ID: Scott Rivas, male    DOB: 07-Jul-1995, 27 y.o.   MRN: [id number]  HPI Patient is 2 weeks follow-up status post perforated left TM secondary to insertion of a Q-tip.  Patient was seen at urgent care clinic which verified the puncture.  Patient was placed on Cortisporin eardrops and given aftercare instructions.  Patient continue use foam earplugs at work and a silicone ear plug when showering.  Patient states continues having tinnitus and decreased hearing but denies vertigo.   Review of Systems Negative except for chief complaint    Objective:   Physical Exam BP is 137/94, pulse 72, respiration 12, temperature 98.8, patient 90% O2 sat on room air.  Patient weighs 170 pounds and BMI is 26.03. HEENT is unremarkable except for small perforation of the posterior aspect of the left TM.  No bleeding.      Assessment & Plan: Healing puncture left TM  Continue conservative treatment and follow-up in 4 weeks for hearing test.  Return to clinic if recurrence of bleeding or vertigo.

## 2022-10-18 NOTE — Progress Notes (Unsigned)
Has already completed his physical with Dr. Daryel November. Coming today for EX1 lab panel.  AMD

## 2022-10-22 ENCOUNTER — Ambulatory Visit: Payer: Self-pay | Admitting: Physician Assistant

## 2022-10-22 DIAGNOSIS — Z0000 Encounter for general adult medical examination without abnormal findings: Secondary | ICD-10-CM

## 2022-10-23 LAB — CMP12+LP+TP+TSH+6AC+CBC/D/PLT
ALT: 49 IU/L — ABNORMAL HIGH (ref 0–44)
AST: 24 IU/L (ref 0–40)
Albumin/Globulin Ratio: 1.7 (ref 1.2–2.2)
Albumin: 4.6 g/dL (ref 4.3–5.2)
Alkaline Phosphatase: 35 IU/L — ABNORMAL LOW (ref 44–121)
BUN/Creatinine Ratio: 16 (ref 9–20)
BUN: 14 mg/dL (ref 6–20)
Basophils Absolute: 0.1 10*3/uL (ref 0.0–0.2)
Basos: 1 %
Bilirubin Total: 0.3 mg/dL (ref 0.0–1.2)
Calcium: 9.9 mg/dL (ref 8.7–10.2)
Chloride: 102 mmol/L (ref 96–106)
Chol/HDL Ratio: 14.8 ratio — ABNORMAL HIGH (ref 0.0–5.0)
Cholesterol, Total: 296 mg/dL — ABNORMAL HIGH (ref 100–199)
Creatinine, Ser: 0.88 mg/dL (ref 0.76–1.27)
EOS (ABSOLUTE): 0.2 10*3/uL (ref 0.0–0.4)
Eos: 4 %
Estimated CHD Risk: 2.5 times avg. — ABNORMAL HIGH (ref 0.0–1.0)
Free Thyroxine Index: 1.9 (ref 1.2–4.9)
GGT: 13 IU/L (ref 0–65)
Globulin, Total: 2.7 g/dL (ref 1.5–4.5)
Glucose: 88 mg/dL (ref 70–99)
HDL: 20 mg/dL — ABNORMAL LOW (ref >39)
Hematocrit: 46.7 % (ref 37.5–51.0)
Hemoglobin: 15.5 g/dL (ref 13.0–17.7)
Immature Grans (Abs): 0 10*3/uL (ref 0.0–0.1)
Immature Granulocytes: 0 %
Iron: 70 ug/dL (ref 38–169)
LDH: 174 IU/L (ref 121–224)
LDL Chol Calc (NIH): 254 mg/dL — ABNORMAL HIGH (ref 0–99)
Lymphocytes Absolute: 1.7 10*3/uL (ref 0.7–3.1)
Lymphs: 34 %
MCH: 30 pg (ref 26.6–33.0)
MCHC: 33.2 g/dL (ref 31.5–35.7)
MCV: 90 fL (ref 79–97)
Monocytes Absolute: 0.4 10*3/uL (ref 0.1–0.9)
Monocytes: 8 %
Neutrophils Absolute: 2.7 10*3/uL (ref 1.4–7.0)
Neutrophils: 53 %
Phosphorus: 3.6 mg/dL (ref 2.8–4.1)
Platelets: 307 10*3/uL (ref 150–450)
Potassium: 4.5 mmol/L (ref 3.5–5.2)
RBC: 5.17 x10E6/uL (ref 4.14–5.80)
RDW: 12.7 % (ref 11.6–15.4)
Sodium: 141 mmol/L (ref 134–144)
T3 Uptake Ratio: 30 % (ref 24–39)
T4, Total: 6.2 ug/dL (ref 4.5–12.0)
TSH: 2.43 u[IU]/mL (ref 0.450–4.500)
Total Protein: 7.3 g/dL (ref 6.0–8.5)
Triglycerides: 120 mg/dL (ref 0–149)
Uric Acid: 2.4 mg/dL — ABNORMAL LOW (ref 3.8–8.4)
VLDL Cholesterol Cal: 22 mg/dL (ref 5–40)
WBC: 5 10*3/uL (ref 3.4–10.8)
eGFR: 121 mL/min/{1.73_m2} (ref >59)

## 2023-02-11 ENCOUNTER — Encounter: Payer: Self-pay | Admitting: Otolaryngology

## 2023-02-11 NOTE — Anesthesia Preprocedure Evaluation (Addendum)
Anesthesia Evaluation  Patient identified by MRN, date of birth, ID band Patient awake    Reviewed: Allergy & Precautions, H&P , NPO status , Patient's Chart, lab work & pertinent test results  Airway Mallampati: III  TM Distance: <3 FB Neck ROM: Full   Comment: Suggest consideration of videolaryngoscope for intuation, short TMD Dental  (+) Chipped Chipped appearance upper central incisors:   Pulmonary neg pulmonary ROS Hx spontaneous pneumothorax   Pulmonary exam normal breath sounds clear to auscultation       Cardiovascular negative cardio ROS Normal cardiovascular exam Rhythm:Regular Rate:Normal     Neuro/Psych negative neurological ROS  negative psych ROS   GI/Hepatic Neg liver ROS,GERD  ,,Severe GERD   Endo/Other  negative endocrine ROS    Renal/GU negative Renal ROS  negative genitourinary   Musculoskeletal negative musculoskeletal ROS (+)    Abdominal   Peds negative pediatric ROS (+)  Hematology negative hematology ROS (+)   Anesthesia Other Findings Hyperlipidemia Hx pneumothorax   Reproductive/Obstetrics negative OB ROS                             Anesthesia Physical Anesthesia Plan  ASA: 2  Anesthesia Plan: General ETT   Post-op Pain Management: Oxycodone PO, Toradol IV (intra-op) and Fentanyl IV   Induction: Intravenous and Rapid sequence  PONV Risk Score and Plan:   Airway Management Planned: Oral ETT  Additional Equipment:   Intra-op Plan:   Post-operative Plan: Extubation in OR  Informed Consent: I have reviewed the patients History and Physical, chart, labs and discussed the procedure including the risks, benefits and alternatives for the proposed anesthesia with the patient or authorized representative who has indicated his/her understanding and acceptance.     Dental Advisory Given  Plan Discussed with: Anesthesiologist, CRNA and  Surgeon  Anesthesia Plan Comments: (Patient consented for risks of anesthesia including but not limited to:  - adverse reactions to medications - damage to eyes, teeth, lips or other oral mucosa - nerve damage due to positioning  - sore throat or hoarseness - Damage to heart, brain, nerves, lungs, other parts of body or loss of life  Patient voiced understanding.)        Anesthesia Quick Evaluation

## 2023-02-13 ENCOUNTER — Other Ambulatory Visit: Payer: Self-pay

## 2023-02-13 MED ORDER — CIPROFLOXACIN-DEXAMETHASONE 0.3-0.1 % OT SUSP
4.0000 [drp] | Freq: Two times a day (BID) | OTIC | 0 refills | Status: DC
  Filled 2023-02-13: qty 7.5, 5d supply, fill #0

## 2023-02-19 ENCOUNTER — Other Ambulatory Visit: Payer: Self-pay

## 2023-02-19 ENCOUNTER — Ambulatory Visit: Payer: 59 | Admitting: Anesthesiology

## 2023-02-19 ENCOUNTER — Encounter: Payer: Self-pay | Admitting: Otolaryngology

## 2023-02-19 ENCOUNTER — Ambulatory Visit
Admission: RE | Admit: 2023-02-19 | Discharge: 2023-02-19 | Disposition: A | Discharge status: Home / Self Care | Payer: 59 | Attending: Otolaryngology | Admitting: Otolaryngology

## 2023-02-19 ENCOUNTER — Encounter
Admission: RE | Disposition: A | Discharge status: Home / Self Care | Payer: Self-pay | Source: Home / Self Care | Attending: Otolaryngology

## 2023-02-19 DIAGNOSIS — S0922XA Traumatic rupture of left ear drum, initial encounter: Secondary | ICD-10-CM | POA: Diagnosis present

## 2023-02-19 DIAGNOSIS — X58XXXA Exposure to other specified factors, initial encounter: Secondary | ICD-10-CM | POA: Insufficient documentation

## 2023-02-19 HISTORY — DX: Gastro-esophageal reflux disease without esophagitis: K21.9

## 2023-02-19 HISTORY — DX: Personal history of other diseases of the respiratory system: Z87.09

## 2023-02-19 HISTORY — PX: TYMPANOPLASTY: SHX33

## 2023-02-19 HISTORY — DX: Hyperlipidemia, unspecified: E78.5

## 2023-02-19 SURGERY — TYMPANOPLASTY
Anesthesia: General | Laterality: Left

## 2023-02-19 MED ORDER — ACETAMINOPHEN 10 MG/ML IV SOLN
1000.0000 mg | Freq: Once | INTRAVENOUS | Status: AC
  Administered 2023-02-19: 1000 mg via INTRAVENOUS

## 2023-02-19 MED ORDER — EPINEPHRINE PF 1 MG/ML IJ SOLN
INTRAMUSCULAR | Status: DC | PRN
  Administered 2023-02-19: .3 mg via SUBCUTANEOUS

## 2023-02-19 MED ORDER — DEXAMETHASONE SODIUM PHOSPHATE 4 MG/ML IJ SOLN
INTRAMUSCULAR | Status: DC | PRN
  Administered 2023-02-19: 8 mg via INTRAVENOUS

## 2023-02-19 MED ORDER — OXYCODONE HCL 5 MG PO TABS
10.0000 mg | ORAL_TABLET | Freq: Once | ORAL | Status: AC
  Administered 2023-02-19: 10 mg via ORAL

## 2023-02-19 MED ORDER — OFLOXACIN 0.3 % OP SOLN: 4.0000 [drp] | Freq: Two times a day (BID) | OPHTHALMIC | 5 refills | Status: AC

## 2023-02-19 MED ORDER — PROPOFOL 10 MG/ML IV BOLUS
INTRAVENOUS | Status: DC | PRN
  Administered 2023-02-19: 200 mg via INTRAVENOUS

## 2023-02-19 MED ORDER — HYDROCODONE-ACETAMINOPHEN 5-325 MG PO TABS
1.0000 | ORAL_TABLET | Freq: Four times a day (QID) | ORAL | 0 refills | Status: AC | PRN
Start: 2023-02-19 — End: ?

## 2023-02-19 MED ORDER — LACTATED RINGERS IV SOLN: INTRAVENOUS | Status: DC | PRN

## 2023-02-19 MED ORDER — LIDOCAINE-EPINEPHRINE 1 %-1:100000 IJ SOLN
INTRAMUSCULAR | Status: DC | PRN
  Administered 2023-02-19: 3 mL

## 2023-02-19 MED ORDER — SUCCINYLCHOLINE CHLORIDE 200 MG/10ML IV SOSY
PREFILLED_SYRINGE | INTRAVENOUS | Status: DC | PRN
  Administered 2023-02-19: 100 mg via INTRAVENOUS

## 2023-02-19 MED ORDER — FENTANYL CITRATE PF 50 MCG/ML IJ SOSY: 50.0000 ug | PREFILLED_SYRINGE | INTRAMUSCULAR | Status: DC | PRN

## 2023-02-19 MED ORDER — FENTANYL CITRATE (PF) 100 MCG/2ML IJ SOLN
INTRAMUSCULAR | Status: DC | PRN
  Administered 2023-02-19: 100 ug via INTRAVENOUS

## 2023-02-19 MED ORDER — ONDANSETRON HCL 4 MG/2ML IJ SOLN
INTRAMUSCULAR | Status: DC | PRN
Start: 2023-02-19 — End: 2023-02-19
  Administered 2023-02-19: 4 mg via INTRAVENOUS

## 2023-02-19 MED ORDER — MIDAZOLAM HCL 5 MG/5ML IJ SOLN
INTRAMUSCULAR | Status: DC | PRN
  Administered 2023-02-19: 2 mg via INTRAVENOUS

## 2023-02-19 MED ORDER — GELATIN ABSORBABLE 12-7 MM EX MISC
CUTANEOUS | Status: DC | PRN
  Administered 2023-02-19: 1

## 2023-02-19 MED ORDER — CIPROFLOXACIN-DEXAMETHASONE 0.3-0.1 % OT SUSP
OTIC | Status: DC | PRN
  Administered 2023-02-19: 4 [drp] via OTIC

## 2023-02-19 MED ORDER — LIDOCAINE HCL (CARDIAC) PF 100 MG/5ML IV SOSY
PREFILLED_SYRINGE | INTRAVENOUS | Status: DC | PRN
  Administered 2023-02-19: 100 mg via INTRAVENOUS

## 2023-02-19 SURGICAL SUPPLY — 33 items
ADH LQ OCL WTPRF AMP STRL LF (MISCELLANEOUS) ×2
ADHESIVE MASTISOL STRL (MISCELLANEOUS) ×1 IMPLANT
BALL CTTN LRG ABS STRL LF (GAUZE/BANDAGES/DRESSINGS) ×1
BLADE EAR TYMPAN 2.5 60D BEAV (BLADE) ×1 IMPLANT
CANISTER SUCT 1200ML W/VALVE (MISCELLANEOUS) ×1 IMPLANT
CATH IV 18X1 1/4 SAFELET (CATHETERS) ×1 IMPLANT
COTTONBALL LRG STERILE PKG (GAUZE/BANDAGES/DRESSINGS) ×1 IMPLANT
DRAIN PENROSE 12X.25 LTX STRL (MISCELLANEOUS) IMPLANT
DRAPE MICROSCOPE ZEISS INVISI (DRAPES) ×1 IMPLANT
DRAPE U-SHAPE 48X52 POLY STRL (PACKS) IMPLANT
DRSG GLASSCOCK MASTOID ADT (GAUZE/BANDAGES/DRESSINGS) IMPLANT
GAUZE SPONGE 4X4 12PLY STRL (GAUZE/BANDAGES/DRESSINGS) IMPLANT
GLOVE PI ULTRA LF STRL 7.5 (GLOVE) IMPLANT
GLOVE SURG ENC MOIS LTX SZ7.5 (GLOVE) ×2 IMPLANT
GLOVE SURG TRIUMPH 8.0 PF LTX (GLOVE) ×1 IMPLANT
GOWN STRL REUS W/ TWL LRG LVL3 (GOWN DISPOSABLE) ×1 IMPLANT
GOWN STRL REUS W/TWL LRG LVL3 (GOWN DISPOSABLE) ×1
IV CATH 18X1 1/4 SAFELET (CATHETERS) ×1
KIT TURNOVER KIT A (KITS) ×1 IMPLANT
NS IRRIG 500ML POUR BTL (IV SOLUTION) ×1 IMPLANT
PACK ENT CUSTOM (PACKS) ×1 IMPLANT
PENCIL SMOKE EVACUATOR (MISCELLANEOUS) IMPLANT
SLEEVE PROTECTION STRL DISP (MISCELLANEOUS) ×2 IMPLANT
SOL PREP PVP 2OZ (MISCELLANEOUS) ×1
SOLUTION PREP PVP 2OZ (MISCELLANEOUS) ×1 IMPLANT
SPONGE SURGIFOAM ABS GEL 12-7 (HEMOSTASIS) IMPLANT
STRAP BODY AND KNEE 60X3 (MISCELLANEOUS) ×1 IMPLANT
SUT PLAIN GUT FAST 5-0 (SUTURE) ×1 IMPLANT
SUT VIC AB 4-0 RB1 27 (SUTURE) ×1
SUT VIC AB 4-0 RB1 27X BRD (SUTURE) ×1 IMPLANT
SYR 10ML LL (SYRINGE) ×1 IMPLANT
SYR 3ML LL SCALE MARK (SYRINGE) ×1 IMPLANT
SYR EAR/ULCER 2OZ (SYRINGE) ×1 IMPLANT

## 2023-02-19 NOTE — Op Note (Signed)
02/19/2023  2:36 PM    Scott Rivas  [id number]   Pre-Op Diagnosis:  Perforation left tympanic membrane  Post-op Diagnosis: Perforation left tympanic membrane  Procedure:   LEFT TYMPANOPLASTY  Surgeon:  Sandi Mealy  Anesthesia:  General endotracheal  EBL:  Less than 25 cc  Complications:  None  Findings: 50% inferior/posterior TM perforation   Procedure: The patient was taken to the Operating Room and placed in the supine position.  After induction of general endotracheal anesthesia, the patient was turned 90 degrees. After a time-out confirming the consented procedure and site, 1% lidocaine with epinephrine 1: 100,000 was injected into the postauricular region and, under visualization with the operating microscope, into the canal skin in the posterior and inferior canal. The left ear was then prepped and draped in the usual sterile fashion. The ear was evaluated under the operating microscope through an ear speculum, and the canal cleaned and irrigated with saline, and the perforation inspected. The findings were as described above. Vertical canal incisions were then made at 12:00 and 6:00, followed by a horizontal incision approximately 4mm posterior to the annulus.    An incision was made with a 15 blade just behind the post-auricular crease below the hairline. The incision was carried down through the subcutaneous tissues with the Bovie, and dissection carried down to the temporalis fascia. Scissors were used to harvest an adequately sized fascia graft, which was set aside, pressed and dried for later use. Next dissection proceeded down into the canal, elevating the posterior canal vascular strip until the previously made canal cuts were encountered. The ear was then retracted forward with a Penrose drain through the canal, and a self retainer.   The margin of the perforation was carefully de-epithelialized with a pick and cup forceps, scraping the undersurface of the margin  to remove all epithelium.   A weapon was used to elevate the canal skin down to the annulus, and the annulus was then carefully elevated from the tympanic ring with a Rosen needle, taking care to avoid injury to the Chorda Tympani nerve, which was preserved. The tympanomeatal flap was elevated and the middle ear inspected.    The fascial graft was then trimmed and placed into position under the perforation. Floxin moistened Gelfoam was used to pack the middle ear, supporting the graft against the undersurface of the tympanic membrane anteriorly. The tympanomeatal flap was then placed back into anatomic position, and good placement of the graft confirmed. Hemostasis was obtained and the post-auricular wound closed with 4-0 Vicryl suture for the subcutaneous closure, followed by 5-0 fast absorbing gut suture in a running locked stitch for the skin.   The canal was then packed with Ciprodex moistened Gelfoam after everting the posterior canal flap and placing it in anatomic position.  Bacitracin ointment was applied to the postauricular incision. A cotton ball was placed at the external meatus, and a Glasscock dressing applied to the ear.    The patient was then returned to the anesthesiologist for awakening, and was taken to the Recovery Room in stable condition.   Disposition:   PACU then discharge home   Plan: Remove the ear dressing as instructed, then start Floxin ear drops as prescribed and water precautions.  Antibiotic ointment to the postauricular wound. Follow-up at my office as scheduled.  Sandi Mealy 02/19/2023 2:36 PM

## 2023-02-19 NOTE — Transfer of Care (Signed)
Immediate Anesthesia Transfer of Care Note  Patient: Scott Rivas  Procedure(s) Performed: TYMPANOPLASTY (Left)  Patient Location: PACU  Anesthesia Type: General ETT  Level of Consciousness: awake, alert  and patient cooperative  Airway and Oxygen Therapy: Patient Spontanous Breathing and Patient connected to supplemental oxygen  Post-op Assessment: Post-op Vital signs reviewed, Patient's Cardiovascular Status Stable, Respiratory Function Stable, Patent Airway and No signs of Nausea or vomiting  Post-op Vital Signs: Reviewed and stable  Complications: No notable events documented.

## 2023-02-19 NOTE — H&P (Signed)
Scott Rivas, Scott Rivas [id number] January 31, 1996  Date of Admission: @TODAY @ Admitting Physician: Sandi Mealy  Chief Complaint: Chronic OM  HPI: This 27 y.o. year old male has a history of traumatic left tympanic membrane perforation. No recent URI, fever or ear drainage  Medications:  Medications Prior to Admission  Medication Sig Dispense Refill   ezetimibe (ZETIA) 10 MG tablet Take 10 mg by mouth daily.     neomycin-polymyxin-hydrocortisone (CORTISPORIN) 3.5-10000-1 OTIC suspension Place 4 drops into the left ear 3 (three) times daily. 10 mL 0   valACYclovir (VALTREX) 500 MG tablet Take 500 mg by mouth 2 (two) times daily.     ciprofloxacin-dexamethasone (CIPRODEX) OTIC suspension Place 4 drops into the left ear 2 (two) times daily for 5 days. (DOS 02/19/23) (Patient not taking: Reported on 02/19/2023) 7.5 mL 0    Allergies: No Known Allergies  PMH:  Past Medical History:  Diagnosis Date   Hx of pneumothorax    Hyperlipidemia     Fam Hx:  Family History  Problem Relation Age of Onset   Stroke Mother    Cancer Maternal Grandfather        lung    Soc Hx:  Social History   Socioeconomic History   Marital status: Significant Other    Spouse name: Not on file   Number of children: Not on file   Years of education: Not on file   Highest education level: Not on file  Occupational History   Not on file  Tobacco Use   Smoking status: Never   Smokeless tobacco: Never  Substance and Sexual Activity   Alcohol use: Yes    Comment: social   Drug use: No   Sexual activity: Not on file  Other Topics Concern   Not on file  Social History Narrative   Not on file   Social Determinants of Health   Financial Resource Strain: Not on file  Food Insecurity: Not on file  Transportation Needs: Not on file  Physical Activity: Not on file  Stress: Not on file  Social Connections: Not on file  Intimate Partner Violence: Not on file    PSH:  Past Surgical History:  Procedure  Laterality Date   CYST EXCISION     Left Side of his mid back   WISDOM TOOTH EXTRACTION    .   PHYSICAL EXAM  Vitals: Blood pressure (!) 128/90, pulse 77, temperature 98.4 F (36.9 C), temperature source Temporal, resp. rate 16, height 6' (1.829 m), weight 74.8 kg, SpO2 100%.. General: Well-developed, Well-nourished in no acute distress Mood: Mood and affect well adjusted, pleasant and cooperative. Orientation: Grossly alert and oriented. Ear: left TM perforation, no infection Vocal Quality: No hoarseness. Communicates verbally. head and Face: NCAT. No facial asymmetry. No visible skin lesions. No significant facial scars. No tenderness with sinus percussion. Facial strength normal and symmetric. Respiratory: Normal respiratory effort without labored breathing. Lungs CTA bilaterally Cardiovascular: Heart exam shows regular rate and rhythm  ASSESSMENT: Left tympanic membrane perforation  PLAN: Left tympanoplasty  Sandi Mealy 02/19/2023 10:50 AM

## 2023-02-21 ENCOUNTER — Other Ambulatory Visit: Payer: Self-pay

## 2023-02-21 ENCOUNTER — Encounter: Payer: Self-pay | Admitting: Otolaryngology

## 2023-02-21 NOTE — Anesthesia Postprocedure Evaluation (Signed)
Anesthesia Post Note  Patient: Scott Rivas  Procedure(s) Performed: TYMPANOPLASTY (Left)  Patient location during evaluation: PACU Anesthesia Type: General Level of consciousness: awake and alert Pain management: pain level controlled Vital Signs Assessment: post-procedure vital signs reviewed and stable Respiratory status: spontaneous breathing, nonlabored ventilation, respiratory function stable and patient connected to nasal cannula oxygen Cardiovascular status: blood pressure returned to baseline and stable Postop Assessment: no apparent nausea or vomiting Anesthetic complications: no   No notable events documented.   Last Vitals:  Vitals:   02/19/23 1448 02/19/23 1506  BP: 119/76 125/87  Pulse: 81 74  Resp: 20 10  Temp: 36.5 C (!) 36.4 C  SpO2: 100% 100%    Last Pain:  Vitals:   02/20/23 1053  TempSrc:   PainSc: 0-No pain                 Sabirin Baray C Hadli Vandemark

## 2023-06-24 ENCOUNTER — Telehealth: Payer: 59 | Admitting: Physician Assistant

## 2023-06-24 DIAGNOSIS — B001 Herpesviral vesicular dermatitis: Secondary | ICD-10-CM

## 2023-06-25 ENCOUNTER — Other Ambulatory Visit: Payer: Self-pay

## 2023-06-25 MED ORDER — VALACYCLOVIR HCL 1 G PO TABS
2000.0000 mg | ORAL_TABLET | Freq: Two times a day (BID) | ORAL | 0 refills | Status: AC
Start: 2023-06-25 — End: 2023-06-28
  Filled 2023-06-25: qty 4, 1d supply, fill #0

## 2023-06-25 NOTE — Progress Notes (Signed)

## 2023-09-23 NOTE — Progress Notes (Unsigned)
 Labs for annual physical - completing physical with off-site provider (Dr. Daryel November)

## 2023-09-24 ENCOUNTER — Other Ambulatory Visit: Payer: Self-pay

## 2023-09-24 DIAGNOSIS — Z0000 Encounter for general adult medical examination without abnormal findings: Secondary | ICD-10-CM

## 2023-09-26 LAB — CMP12+LP+TP+TSH+6AC+CBC/D/PLT
ALT: 26 IU/L (ref 0–44)
AST: 21 IU/L (ref 0–40)
Albumin: 4.7 g/dL (ref 4.3–5.2)
Alkaline Phosphatase: 55 IU/L (ref 44–121)
BUN/Creatinine Ratio: 22 — ABNORMAL HIGH (ref 9–20)
BUN: 20 mg/dL (ref 6–20)
Basophils Absolute: 0 10*3/uL (ref 0.0–0.2)
Basos: 1 %
Bilirubin Total: 0.5 mg/dL (ref 0.0–1.2)
Calcium: 10 mg/dL (ref 8.7–10.2)
Chloride: 103 mmol/L (ref 96–106)
Chol/HDL Ratio: 4.4 ratio (ref 0.0–5.0)
Cholesterol, Total: 167 mg/dL (ref 100–199)
Creatinine, Ser: 0.89 mg/dL (ref 0.76–1.27)
EOS (ABSOLUTE): 0.2 10*3/uL (ref 0.0–0.4)
Eos: 4 %
Estimated CHD Risk: 0.9 times avg. (ref 0.0–1.0)
Free Thyroxine Index: 2.2 (ref 1.2–4.9)
GGT: 19 IU/L (ref 0–65)
Globulin, Total: 2.5 g/dL (ref 1.5–4.5)
Glucose: 90 mg/dL (ref 70–99)
HDL: 38 mg/dL — ABNORMAL LOW (ref >39)
Hematocrit: 48.9 % (ref 37.5–51.0)
Hemoglobin: 16.6 g/dL (ref 13.0–17.7)
Immature Grans (Abs): 0 10*3/uL (ref 0.0–0.1)
Immature Granulocytes: 0 %
Iron: 95 ug/dL (ref 38–169)
LDH: 184 IU/L (ref 121–224)
LDL Chol Calc (NIH): 110 mg/dL — ABNORMAL HIGH (ref 0–99)
Lymphocytes Absolute: 1.8 10*3/uL (ref 0.7–3.1)
Lymphs: 33 %
MCH: 30.7 pg (ref 26.6–33.0)
MCHC: 33.9 g/dL (ref 31.5–35.7)
MCV: 90 fL (ref 79–97)
Monocytes Absolute: 0.5 10*3/uL (ref 0.1–0.9)
Monocytes: 8 %
Neutrophils Absolute: 2.9 10*3/uL (ref 1.4–7.0)
Neutrophils: 54 %
Phosphorus: 3.7 mg/dL (ref 2.8–4.1)
Platelets: 259 10*3/uL (ref 150–450)
Potassium: 4.4 mmol/L (ref 3.5–5.2)
RBC: 5.41 x10E6/uL (ref 4.14–5.80)
RDW: 12.6 % (ref 11.6–15.4)
Sodium: 143 mmol/L (ref 134–144)
T3 Uptake Ratio: 27 % (ref 24–39)
T4, Total: 8 ug/dL (ref 4.5–12.0)
TSH: 2.81 u[IU]/mL (ref 0.450–4.500)
Total Protein: 7.2 g/dL (ref 6.0–8.5)
Triglycerides: 104 mg/dL (ref 0–149)
Uric Acid: 4.3 mg/dL (ref 3.8–8.4)
VLDL Cholesterol Cal: 19 mg/dL (ref 5–40)
WBC: 5.5 10*3/uL (ref 3.4–10.8)
eGFR: 120 mL/min/{1.73_m2} (ref >59)

## 2023-09-26 LAB — HGB A1C W/O EAG: Hgb A1c MFr Bld: 5.3 % (ref 4.8–5.6)

## 2023-09-26 LAB — VITAMIN B12: Vitamin B-12: 615 pg/mL (ref 232–1245)

## 2023-09-26 LAB — VITAMIN D 25 HYDROXY (VIT D DEFICIENCY, FRACTURES): Vit D, 25-Hydroxy: 26.7 ng/mL — ABNORMAL LOW (ref 30.0–100.0)

## 2023-09-26 LAB — TESTOSTERONE: Testosterone: 519 ng/dL (ref 264–916)

## 2024-01-22 ENCOUNTER — Other Ambulatory Visit: Payer: Self-pay | Admitting: Emergency Medicine

## 2024-01-22 DIAGNOSIS — M25562 Pain in left knee: Secondary | ICD-10-CM

## 2024-01-23 ENCOUNTER — Encounter: Payer: Self-pay | Admitting: Emergency Medicine

## 2024-01-29 ENCOUNTER — Ambulatory Visit
Admission: RE | Admit: 2024-01-29 | Discharge: 2024-01-29 | Disposition: A | Discharge status: Home / Self Care | Source: Ambulatory Visit | Attending: Emergency Medicine | Admitting: Emergency Medicine

## 2024-01-29 DIAGNOSIS — M25562 Pain in left knee: Secondary | ICD-10-CM

## 2024-01-31 ENCOUNTER — Other Ambulatory Visit

## 2024-07-02 ENCOUNTER — Ambulatory Visit

## 2024-07-02 NOTE — Progress Notes (Signed)
 Ordered from PCP Dr. Trudy, he was given in LD at request the active order for the Testosterone  150mg  and tolerated well supplying his own medicine and reports no adverse effect.  He follows up with PCP next week for labs and another order.

## 2024-07-10 ENCOUNTER — Ambulatory Visit: Payer: Self-pay

## 2024-07-10 DIAGNOSIS — E349 Endocrine disorder, unspecified: Secondary | ICD-10-CM

## 2024-07-10 MED ORDER — TESTOSTERONE CYPIONATE 200 MG/ML IM SOLN
150.0000 mg | INTRAMUSCULAR | Status: DC
  Administered 2024-07-10 – 2024-07-24 (×2): 150 mg via INTRAMUSCULAR

## 2024-07-10 NOTE — Progress Notes (Signed)
Pt received weekly injection

## 2024-07-24 ENCOUNTER — Ambulatory Visit: Payer: Self-pay

## 2024-07-24 NOTE — Progress Notes (Signed)
 Presents to [facility] for administration of Testosterone  injection.  Testosterone  0.75 ml administered in IM in left glute as prescribed by Dr. Dorn Pouch.  States he doesn't want to take injections the rest of his life & Dr. Pouch is considering weaning him off testosterone  injections & switching him to Clomid.

## 2024-07-31 ENCOUNTER — Ambulatory Visit: Payer: Self-pay

## 2024-07-31 DIAGNOSIS — E349 Endocrine disorder, unspecified: Secondary | ICD-10-CM

## 2024-07-31 MED ORDER — TESTOSTERONE CYPIONATE 200 MG/ML IM SOLN
100.0000 mg | INTRAMUSCULAR | Status: AC
  Administered 2024-07-31: 100 mg via INTRAMUSCULAR

## 2024-07-31 NOTE — Progress Notes (Signed)
 Presents to [facility] for weekly Testosterone  injection prescribed by Dr. Dorn Pouch.  States Dr. Pouch lowered the dose from 0.75 ml to 0.5 ml because he's weaning him off the testosterone  & starting him on Clomid.  States he takes the Clomid 3x /week (Monday, Wednesday & Friday).  Said Dr. Pouch advised him to get 2-3 more testosterone  injections before switching over to only Clomid.
